# Patient Record
Sex: Female | Born: 2000 | Race: Black or African American | Hispanic: No | Marital: Single | Smoking: Current every day smoker
Health system: Southern US, Community
[De-identification: ages and names within clinical notes are randomized; demographics above are authoritative.]

## PROBLEM LIST (undated history)

## (undated) ENCOUNTER — Ambulatory Visit (HOSPITAL_COMMUNITY): Admission: EM | Payer: MEDICAID | Source: Home / Self Care

## (undated) DIAGNOSIS — R519 Headache, unspecified: Secondary | ICD-10-CM

## (undated) DIAGNOSIS — F419 Anxiety disorder, unspecified: Secondary | ICD-10-CM

## (undated) DIAGNOSIS — L309 Dermatitis, unspecified: Secondary | ICD-10-CM

## (undated) DIAGNOSIS — N939 Abnormal uterine and vaginal bleeding, unspecified: Secondary | ICD-10-CM

## (undated) DIAGNOSIS — N946 Dysmenorrhea, unspecified: Secondary | ICD-10-CM

## (undated) DIAGNOSIS — R51 Headache: Secondary | ICD-10-CM

## (undated) DIAGNOSIS — F32A Depression, unspecified: Secondary | ICD-10-CM

## (undated) HISTORY — DX: Abnormal uterine and vaginal bleeding, unspecified: N93.9

## (undated) HISTORY — DX: Dysmenorrhea, unspecified: N94.6

## (undated) HISTORY — DX: Depression, unspecified: F32.A

## (undated) HISTORY — DX: Anxiety disorder, unspecified: F41.9

---
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
## 2018-09-03 ENCOUNTER — Ambulatory Visit (INDEPENDENT_AMBULATORY_CARE_PROVIDER_SITE_OTHER): Payer: Self-pay

## 2018-09-03 VITALS — Wt 249.0 lb

## 2018-09-03 DIAGNOSIS — Z113 Encounter for screening for infections with a predominantly sexual mode of transmission: Secondary | ICD-10-CM

## 2018-09-03 DIAGNOSIS — N898 Other specified noninflammatory disorders of vagina: Secondary | ICD-10-CM

## 2018-09-03 NOTE — Progress Notes (Signed)
Pt is here for STI testing, she is having discharge no smell no itching no burning. Wants to just get checked to be sure. 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

## 2018-09-05 LAB — CERVICOVAGINAL ANCILLARY ONLY
Bacterial vaginitis: NEGATIVE
CANDIDA VAGINITIS: NEGATIVE
CHLAMYDIA, DNA PROBE: NEGATIVE
Neisseria Gonorrhea: NEGATIVE
TRICH (WINDOWPATH): POSITIVE — AB

## 2018-09-09 ENCOUNTER — Other Ambulatory Visit: Payer: Self-pay | Admitting: Family Medicine

## 2018-09-09 ENCOUNTER — Telehealth: Payer: Self-pay | Admitting: *Deleted

## 2018-09-09 DIAGNOSIS — A599 Trichomoniasis, unspecified: Secondary | ICD-10-CM

## 2018-09-09 MED ORDER — METRONIDAZOLE 500 MG PO TABS: 2000.0000 mg | ORAL_TABLET | Freq: Once | ORAL | 0 refills | Status: AC

## 2018-09-09 MED ORDER — METRONIDAZOLE 500 MG PO TABS: 2000.0000 mg | ORAL_TABLET | Freq: Once | ORAL | 0 refills | Status: DC

## 2018-09-09 NOTE — Telephone Encounter (Signed)
Received a voicemail  from [facility] to clarify order sent in by Dr. Aneta Mins for flagyl.  I called [facility] and wanted to clarify if order was for 4 tablets or 14 tablets. Per chart review was for treatment of trichomonas and our protocol is 2 grams ( 4 tablets ) once so I clarified order is 4 tablets once only.  Will notify Dr. Vear Clock.

## 2018-09-09 NOTE — Progress Notes (Signed)
Metronidazole sent to pharmacy for trichomoniasis. Patient notified via [employer].

## 2019-09-12 ENCOUNTER — Emergency Department (HOSPITAL_COMMUNITY): Payer: Self-pay

## 2019-09-12 ENCOUNTER — Encounter (HOSPITAL_COMMUNITY): Payer: Self-pay | Admitting: Emergency Medicine

## 2019-09-12 ENCOUNTER — Emergency Department (HOSPITAL_COMMUNITY)
Admission: EM | Admit: 2019-09-12 | Discharge: 2019-09-12 | Disposition: A | Discharge status: Home / Self Care | Payer: Self-pay | Attending: Emergency Medicine | Admitting: Emergency Medicine

## 2019-09-12 ENCOUNTER — Other Ambulatory Visit: Payer: Self-pay

## 2019-09-12 DIAGNOSIS — Y999 Unspecified external cause status: Secondary | ICD-10-CM | POA: Insufficient documentation

## 2019-09-12 DIAGNOSIS — S93402A Sprain of unspecified ligament of left ankle, initial encounter: Secondary | ICD-10-CM | POA: Insufficient documentation

## 2019-09-12 DIAGNOSIS — Y929 Unspecified place or not applicable: Secondary | ICD-10-CM | POA: Insufficient documentation

## 2019-09-12 DIAGNOSIS — W19XXXA Unspecified fall, initial encounter: Secondary | ICD-10-CM | POA: Insufficient documentation

## 2019-09-12 DIAGNOSIS — F17210 Nicotine dependence, cigarettes, uncomplicated: Secondary | ICD-10-CM | POA: Insufficient documentation

## 2019-09-12 DIAGNOSIS — Y939 Activity, unspecified: Secondary | ICD-10-CM | POA: Insufficient documentation

## 2019-09-12 DIAGNOSIS — S93601A Unspecified sprain of right foot, initial encounter: Secondary | ICD-10-CM | POA: Insufficient documentation

## 2019-09-12 MED ORDER — HYDROCODONE-ACETAMINOPHEN 5-325 MG PO TABS
1.0000 | ORAL_TABLET | Freq: Once | ORAL | Status: AC
  Administered 2019-09-12: 1 via ORAL
  Filled 2019-09-12: qty 1
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
## 2019-09-12 NOTE — Discharge Instructions (Signed)
You can take Tylenol or Ibuprofen as directed for pain. You can alternate Tylenol and Ibuprofen every 4 hours. If you take Tylenol at 1pm, then you can take Ibuprofen at 5pm. Then you can take Tylenol again at 9pm.   Follow the RICE (Rest, Ice, Compression, Elevation) protocol as directed.   Follow up with your Primary Care Doctor as needed.   Return to the Emergency Department immediately for any worsening pain, redness/swelling of the ankle, gray or blue color to the toes, numbness/weakness of toes or foot, difficulty walking or any other worsening or concerning symptoms.    Ankle sprain Ankle sprain occurs when the ligaments that hold the ankle joint to get her are stretched or torn. It may take 4-6 weeks to heal.  For activity: Use crutches with nonweightbearing for the first few days. Then, you may walk on your ankles as the pain allows, or as instructed. Start gradually with weight bearing on the affected ankle. Once you can walk pain free, then try jogging. When you can run forwards, then you can try moving side to side. If you cannot walk without crutches in one week, you need a recheck by your Family Doctor.  If you do not have a family doctor to followup with, you can see the list of phone numbers below. Please call today to make a followup appointment.   RICE therapy:  Routine Care for injuries  Rest, Ice, Compression, Elevation (RICE)  Rest is needed to allow your body to heal. Routine activities can be resumed when comfortable. Injury tendons and bones can take up to 6 weeks to heal. Tendons are cordlike structures that attach muscles and bones.  Ice following an injury helps keep the swelling down and reduce the pain. Put ice in a plastic bag. Place a towel between your skin and the bag of ice. Leave the ice on for 15-20 minutes, 3-4 times a day. Do this while awake, for the first 24-48 hours. After that continue as directed by your caregiver.  Compression helps keep swelling  down. It also gives support and helps with discomfort. If any lasting bandage has been applied, it should be removed and reapplied every 3-4 hours. It should not be applied tightly, but firmly enough to keep swelling down. Watch fingers or toes for swelling, discoloration, coldness, numbness or excessive pain. If any of these problems occur, removed the bandage and reapply loosely. Contact your caregiver if these problems continue.  Elevation helps reduce swelling and decrease your pain. With extremities such as the arms, hands, legs and feet, the injured area should be placed near or above the level of the heart if possible.
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
## 2020-06-09 ENCOUNTER — Ambulatory Visit (HOSPITAL_COMMUNITY)
Admission: EM | Admit: 2020-06-09 | Discharge: 2020-06-09 | Disposition: A | Discharge status: Home / Self Care | Payer: Self-pay | Attending: Family Medicine | Admitting: Family Medicine

## 2020-06-09 ENCOUNTER — Encounter (HOSPITAL_COMMUNITY): Payer: Self-pay | Admitting: *Deleted

## 2020-06-09 ENCOUNTER — Other Ambulatory Visit: Payer: Self-pay

## 2020-06-09 DIAGNOSIS — L301 Dyshidrosis [pompholyx]: Secondary | ICD-10-CM

## 2020-06-09 MED ORDER — PREDNISONE 5 MG PO TABS: ORAL_TABLET | ORAL | 0 refills | Status: DC

## 2020-06-09 NOTE — ED Triage Notes (Signed)
Pt reports she first had a blister on Rt small toe which later opened up . Skin is now dry on Rt small toe. Pt also reports pain between rt  GREAT TOE AND RT 2ND TOE.

## 2020-06-09 NOTE — Discharge Instructions (Signed)
Please try the medicine  Please try lotion  Please follow up if your symptoms fail to improve.
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
## 2020-07-11 ENCOUNTER — Other Ambulatory Visit: Payer: Self-pay

## 2020-07-11 ENCOUNTER — Encounter (HOSPITAL_COMMUNITY): Payer: Self-pay

## 2020-07-11 ENCOUNTER — Ambulatory Visit (HOSPITAL_COMMUNITY)
Admission: EM | Admit: 2020-07-11 | Discharge: 2020-07-11 | Disposition: A | Discharge status: Home / Self Care | Payer: Self-pay | Attending: Emergency Medicine | Admitting: Emergency Medicine

## 2020-07-11 DIAGNOSIS — L309 Dermatitis, unspecified: Secondary | ICD-10-CM

## 2020-07-11 DIAGNOSIS — B084 Enteroviral vesicular stomatitis with exanthem: Secondary | ICD-10-CM

## 2020-07-11 DIAGNOSIS — R21 Rash and other nonspecific skin eruption: Secondary | ICD-10-CM

## 2020-07-11 MED ORDER — TRIAMCINOLONE ACETONIDE 0.1 % EX CREA: 1.0000 "application " | TOPICAL_CREAM | Freq: Two times a day (BID) | CUTANEOUS | 0 refills | Status: DC

## 2020-07-11 MED ORDER — PREDNISONE 10 MG (21) PO TBPK: ORAL_TABLET | Freq: Every day | ORAL | 0 refills | Status: DC
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
## 2020-07-11 NOTE — Discharge Instructions (Signed)
Wash dry areas well  Apply cream  May take several weeks to go away  Keep a good non perfume emollient to area

## 2020-07-11 NOTE — ED Triage Notes (Signed)
Pt presents with recurrent rash/blisters on hands and feet

## 2020-08-08 ENCOUNTER — Encounter (HOSPITAL_COMMUNITY): Payer: Self-pay | Admitting: Emergency Medicine

## 2020-08-08 ENCOUNTER — Emergency Department (HOSPITAL_COMMUNITY)
Admission: EM | Admit: 2020-08-08 | Discharge: 2020-08-09 | Disposition: A | Discharge status: Home / Self Care | Payer: Self-pay | Attending: Emergency Medicine | Admitting: Emergency Medicine

## 2020-08-08 ENCOUNTER — Other Ambulatory Visit: Payer: Self-pay

## 2020-08-08 DIAGNOSIS — M542 Cervicalgia: Secondary | ICD-10-CM | POA: Insufficient documentation

## 2020-08-08 DIAGNOSIS — M545 Low back pain, unspecified: Secondary | ICD-10-CM | POA: Insufficient documentation

## 2020-08-08 DIAGNOSIS — Y92410 Unspecified street and highway as the place of occurrence of the external cause: Secondary | ICD-10-CM | POA: Insufficient documentation

## 2020-08-08 DIAGNOSIS — F17200 Nicotine dependence, unspecified, uncomplicated: Secondary | ICD-10-CM | POA: Insufficient documentation

## 2020-08-08 MED ORDER — METHOCARBAMOL 500 MG PO TABS: 500.0000 mg | ORAL_TABLET | Freq: Two times a day (BID) | ORAL | 0 refills | Status: DC

## 2020-08-08 MED ORDER — NAPROXEN 500 MG PO TABS: 500.0000 mg | ORAL_TABLET | Freq: Two times a day (BID) | ORAL | 0 refills | Status: DC

## 2020-08-08 MED ORDER — LIDOCAINE 5 % EX PTCH
2.0000 | MEDICATED_PATCH | CUTANEOUS | Status: DC
  Administered 2020-08-08: 2 via TRANSDERMAL
  Filled 2020-08-08: qty 2

## 2020-08-08 MED ORDER — NAPROXEN 500 MG PO TABS
500.0000 mg | ORAL_TABLET | Freq: Once | ORAL | Status: AC
  Administered 2020-08-08: 500 mg via ORAL
  Filled 2020-08-08: qty 1

## 2020-08-08 MED ORDER — METHOCARBAMOL 500 MG PO TABS
500.0000 mg | ORAL_TABLET | Freq: Once | ORAL | Status: AC
  Administered 2020-08-08: 500 mg via ORAL
  Filled 2020-08-08: qty 1
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
## 2020-08-08 NOTE — ED Notes (Signed)
ED Provider at bedside. 
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
## 2020-12-23 ENCOUNTER — Other Ambulatory Visit: Payer: Self-pay

## 2020-12-23 ENCOUNTER — Emergency Department (HOSPITAL_COMMUNITY): Payer: Self-pay

## 2020-12-23 ENCOUNTER — Emergency Department (HOSPITAL_COMMUNITY)
Admission: EM | Admit: 2020-12-23 | Discharge: 2020-12-23 | Disposition: A | Discharge status: Home / Self Care | Payer: Self-pay | Attending: Emergency Medicine | Admitting: Emergency Medicine

## 2020-12-23 ENCOUNTER — Encounter (HOSPITAL_COMMUNITY): Payer: Self-pay

## 2020-12-23 DIAGNOSIS — R609 Edema, unspecified: Secondary | ICD-10-CM | POA: Insufficient documentation

## 2020-12-23 DIAGNOSIS — M79641 Pain in right hand: Secondary | ICD-10-CM

## 2020-12-23 DIAGNOSIS — F17290 Nicotine dependence, other tobacco product, uncomplicated: Secondary | ICD-10-CM | POA: Insufficient documentation

## 2020-12-23 DIAGNOSIS — M25541 Pain in joints of right hand: Secondary | ICD-10-CM | POA: Insufficient documentation
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
## 2020-12-23 NOTE — ED Triage Notes (Signed)
Patient states she has had intermittent right hand swelling and pain x 2 years. Patient states worse recently. 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

## 2020-12-23 NOTE — Discharge Instructions (Addendum)
Your hand x-ray was reassuring.  Please follow-up with a hand specialist as we discussed.  Please take Naproxen (aleve) and Tylenol (acetaminophen) to relieve your pain.  You may take two pills of aleve every 12 hours.  After 2-3 days please try to only take one pill every 12 hours.  In between doses of aleve you may take tylenol, up to 1,000 mg (two extra strength pills).  Do not take more than 3,000 mg tylenol in a 24 hour period.  Please check all medication labels as many medications such as pain and cold medications may contain tylenol.  Do not drink alcohol while taking these medications.  Do not take other NSAID'S while taking aleve (naproxen) (such as motrin, ibuprofen, voltaren or advil).  Please take aleve (naproxen) with food to decrease stomach upset.  If the aleve irritates your stomach you may stop and get voltaren gel.  This is over the counter and follow directions on the box.    While in the ED your blood pressure was high.  Please follow up with your primary care doctor or the wellness clinic for repeat evaluation as you may need medication.  High blood pressure can cause long term, potentially serious, damage if left untreated.

## 2021-02-08 ENCOUNTER — Encounter (HOSPITAL_COMMUNITY): Payer: Self-pay

## 2021-02-08 ENCOUNTER — Ambulatory Visit (HOSPITAL_COMMUNITY)
Admission: EM | Admit: 2021-02-08 | Discharge: 2021-02-08 | Disposition: A | Discharge status: Home / Self Care | Payer: Self-pay

## 2021-02-08 ENCOUNTER — Other Ambulatory Visit: Payer: Self-pay

## 2021-02-08 DIAGNOSIS — M722 Plantar fascial fibromatosis: Secondary | ICD-10-CM

## 2021-02-08 NOTE — ED Triage Notes (Signed)
Pt presents with right heel pain. She states she is unable to apply pressure to the right heel and states she feels better when wearing a shoe with extra cushion.

## 2021-02-08 NOTE — Discharge Instructions (Addendum)
Stretch often, see attached stretching information.  Recommend freezing a bottle of water and rolling the foot Can take 600mg  Ibuprofen as needed Can wear night time brace as discussed.  If no improvement follow up with orthopedics.
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
## 2022-03-26 ENCOUNTER — Emergency Department (HOSPITAL_COMMUNITY): Payer: Self-pay

## 2022-03-26 ENCOUNTER — Encounter (HOSPITAL_COMMUNITY): Payer: Self-pay | Admitting: Emergency Medicine

## 2022-03-26 ENCOUNTER — Emergency Department (HOSPITAL_COMMUNITY)
Admission: EM | Admit: 2022-03-26 | Discharge: 2022-03-26 | Disposition: A | Discharge status: Home / Self Care | Payer: Self-pay | Attending: Emergency Medicine | Admitting: Emergency Medicine

## 2022-03-26 DIAGNOSIS — K2900 Acute gastritis without bleeding: Secondary | ICD-10-CM | POA: Insufficient documentation

## 2022-03-26 DIAGNOSIS — M545 Low back pain, unspecified: Secondary | ICD-10-CM | POA: Insufficient documentation

## 2022-03-26 DIAGNOSIS — R1013 Epigastric pain: Secondary | ICD-10-CM

## 2022-03-26 LAB — COMPREHENSIVE METABOLIC PANEL
ALT: 22 U/L (ref 0–44)
AST: 16 U/L (ref 15–41)
Albumin: 4 g/dL (ref 3.5–5.0)
Alkaline Phosphatase: 48 U/L (ref 38–126)
Anion gap: 5 (ref 5–15)
BUN: 16 mg/dL (ref 6–20)
CO2: 22 mmol/L (ref 22–32)
Calcium: 8.9 mg/dL (ref 8.9–10.3)
Chloride: 110 mmol/L (ref 98–111)
Creatinine, Ser: 0.76 mg/dL (ref 0.44–1.00)
GFR, Estimated: >60 mL/min (ref >60)
Glucose, Bld: 88 mg/dL (ref 70–99)
Potassium: 4.2 mmol/L (ref 3.5–5.1)
Sodium: 137 mmol/L (ref 135–145)
Total Bilirubin: 0.4 mg/dL (ref 0.3–1.2)
Total Protein: 7.3 g/dL (ref 6.5–8.1)

## 2022-03-26 LAB — URINALYSIS, ROUTINE W REFLEX MICROSCOPIC
Bilirubin Urine: NEGATIVE
Glucose, UA: NEGATIVE mg/dL
Hgb urine dipstick: NEGATIVE
Ketones, ur: NEGATIVE mg/dL
Leukocytes,Ua: NEGATIVE
Nitrite: NEGATIVE
Protein, ur: NEGATIVE mg/dL
Specific Gravity, Urine: 1.019 (ref 1.005–1.030)
pH: 6 (ref 5.0–8.0)

## 2022-03-26 LAB — CBC WITH DIFFERENTIAL/PLATELET
Abs Immature Granulocytes: 0.05 10*3/uL (ref 0.00–0.07)
Basophils Absolute: 0 10*3/uL (ref 0.0–0.1)
Basophils Relative: 0 %
Eosinophils Absolute: 0.2 10*3/uL (ref 0.0–0.5)
Eosinophils Relative: 1 %
HCT: 42.5 % (ref 36.0–46.0)
Hemoglobin: 13.5 g/dL (ref 12.0–15.0)
Immature Granulocytes: 0 %
Lymphocytes Relative: 24 %
Lymphs Abs: 3.6 10*3/uL (ref 0.7–4.0)
MCH: 26.4 pg (ref 26.0–34.0)
MCHC: 31.8 g/dL (ref 30.0–36.0)
MCV: 83 fL (ref 80.0–100.0)
Monocytes Absolute: 1.2 10*3/uL — ABNORMAL HIGH (ref 0.1–1.0)
Monocytes Relative: 8 %
Neutro Abs: 10 10*3/uL — ABNORMAL HIGH (ref 1.7–7.7)
Neutrophils Relative %: 67 %
Platelets: 245 10*3/uL (ref 150–400)
RBC: 5.12 MIL/uL — ABNORMAL HIGH (ref 3.87–5.11)
RDW: 14.6 % (ref 11.5–15.5)
WBC: 15.1 10*3/uL — ABNORMAL HIGH (ref 4.0–10.5)
nRBC: 0 % (ref 0.0–0.2)

## 2022-03-26 LAB — LIPASE, BLOOD: Lipase: 23 U/L (ref 11–51)

## 2022-03-26 LAB — I-STAT BETA HCG BLOOD, ED (MC, WL, AP ONLY): I-stat hCG, quantitative: <5 m[IU]/mL (ref <5)

## 2022-03-26 MED ORDER — FAMOTIDINE 20 MG PO TABS: 20.0000 mg | ORAL_TABLET | Freq: Two times a day (BID) | ORAL | 0 refills | Status: DC

## 2022-03-26 MED ORDER — LIDOCAINE VISCOUS HCL 2 % MT SOLN
15.0000 mL | Freq: Once | OROMUCOSAL | Status: AC
  Administered 2022-03-26: 15 mL via ORAL
  Filled 2022-03-26: qty 15

## 2022-03-26 MED ORDER — FAMOTIDINE IN NACL 20-0.9 MG/50ML-% IV SOLN
20.0000 mg | Freq: Once | INTRAVENOUS | Status: AC
  Administered 2022-03-26: 20 mg via INTRAVENOUS
  Filled 2022-03-26: qty 50

## 2022-03-26 MED ORDER — KETOROLAC TROMETHAMINE 15 MG/ML IJ SOLN
15.0000 mg | Freq: Once | INTRAMUSCULAR | Status: AC
  Administered 2022-03-26: 15 mg via INTRAVENOUS
  Filled 2022-03-26: qty 1

## 2022-03-26 MED ORDER — ALUM & MAG HYDROXIDE-SIMETH 200-200-20 MG/5ML PO SUSP
30.0000 mL | Freq: Once | ORAL | Status: AC
  Administered 2022-03-26: 30 mL via ORAL
  Filled 2022-03-26: qty 30

## 2022-03-26 NOTE — ED Triage Notes (Signed)
Pt reports sharp abd pain starting last night. Intermittent pain to abd and back. Denies n/v.
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
## 2022-03-26 NOTE — ED Provider Triage Note (Signed)
Emergency Medicine Provider Triage Evaluation Note  Joanna Fitzgerald , a 21 y.o. female  was evaluated in triage.  Pt complains of abdominal pain, intermittent over the past several days.  Pain is in the upper abdomen, right greater than left.  Pain radiates to the right mid back.  No fevers or vomiting.  No history of gallstones.  Review of Systems  Positive: Abdominal pain Negative: Fever  Physical Exam  BP (!) 142/61 (BP Location: Right Arm)   Pulse 76   Temp 98.2 F (36.8 C) (Oral)   Resp 18   Ht 5' 2.5" (1.588 m)   Wt 108.9 kg   LMP 03/20/2022   SpO2 100%   BMI 43.20 kg/m  Gen:   Awake, no distress   Resp:  Normal effort  MSK:   Moves extremities without difficulty  Other:  No rebound or guarding with palpation on the abdomen  Medical Decision Making  Medically screening exam initiated at 2:31 PM.  Appropriate orders placed.  Joanna Fitzgerald was informed that the remainder of the evaluation will be completed by another provider, this initial triage assessment does not replace that evaluation, and the importance of remaining in the ED until their evaluation is complete.     Renne Crigler, PA-C 03/26/22 1441

## 2022-03-26 NOTE — Discharge Instructions (Addendum)
Today you were seen in the emergency department for your abdominal pain.    In the emergency department you had an ultrasound and lab work that was reassuring. It is likely that your symptoms are from irritation of your stomach (gastritis)  At home, please take the pepcid we have prescribed and over the counter maalox as needed for discomfort. Please avoid NSAIDS (ibuprofen, aleve, etc) and alcohol as much as possible as these can cause worsening stomach irritation.    Follow-up with your primary doctor in 2-3 days regarding your visit or use the clinic listed in this packet to establish care with family medicine.   Return immediately to the emergency department if you experience any of the following: worsening pain, high fevers, vomiting, or any other concerning symptoms.    Thank you for visiting our Emergency Department. It was a pleasure taking care of you today.

## 2022-04-29 ENCOUNTER — Emergency Department (HOSPITAL_COMMUNITY)
Admission: EM | Admit: 2022-04-29 | Discharge: 2022-04-29 | Disposition: A | Discharge status: Home / Self Care | Payer: Medicaid Other | Attending: Emergency Medicine | Admitting: Emergency Medicine

## 2022-04-29 ENCOUNTER — Encounter (HOSPITAL_COMMUNITY): Payer: Self-pay

## 2022-04-29 ENCOUNTER — Other Ambulatory Visit: Payer: Self-pay

## 2022-04-29 DIAGNOSIS — J020 Streptococcal pharyngitis: Secondary | ICD-10-CM | POA: Insufficient documentation

## 2022-04-29 DIAGNOSIS — Z20822 Contact with and (suspected) exposure to covid-19: Secondary | ICD-10-CM | POA: Insufficient documentation

## 2022-04-29 LAB — SARS CORONAVIRUS 2 BY RT PCR: SARS Coronavirus 2 by RT PCR: NEGATIVE

## 2022-04-29 LAB — GROUP A STREP BY PCR: Group A Strep by PCR: DETECTED — AB

## 2022-04-29 MED ORDER — PENICILLIN G BENZATHINE 1200000 UNIT/2ML IM SUSY
1.2000 10*6.[IU] | PREFILLED_SYRINGE | Freq: Once | INTRAMUSCULAR | Status: AC
  Administered 2022-04-29: 1.2 10*6.[IU] via INTRAMUSCULAR
  Filled 2022-04-29: qty 2

## 2022-04-29 MED ORDER — DEXAMETHASONE SODIUM PHOSPHATE 10 MG/ML IJ SOLN
10.0000 mg | Freq: Once | INTRAMUSCULAR | Status: AC
  Administered 2022-04-29: 10 mg via INTRAMUSCULAR
  Filled 2022-04-29: qty 1

## 2022-04-29 NOTE — ED Provider Notes (Signed)
[facility]   CSN: [id number] Arrival date & time: 04/29/22  5797     History  Chief Complaint  Patient presents with   Generalized Body Aches    Joanna Fitzgerald is a 21 y.o. female.  With past medical history of eczema who presents to the emergency department with generalized body aches.  States that symptoms began Tuesday.  She describes having sore throat, nonproductive cough, generalized body aches and congestion.  She has had no objective fevers at home.  She denies having productive cough or shortness of breath.  Denies sinus pain or pressure, difficulty swallowing, change in voice.  No sick contacts.  HPI     Home Medications Prior to Admission medications   Medication Sig Start Date End Date Taking? Authorizing Provider  Etonogestrel (NEXPLANON ) Inject into the skin.    [provider]  famotidine (PEPCID) 20 MG tablet Take 1 tablet (20 mg total) by mouth 2 (two) times daily. 03/26/22   Rondel Baton, MD  methocarbamol (ROBAXIN) 500 MG tablet Take 1 tablet (500 mg total) by mouth 2 (two) times daily. 08/08/20   Mannie Stabile, PA-C  naproxen (NAPROSYN) 500 MG tablet Take 1 tablet (500 mg total) by mouth 2 (two) times daily. 08/08/20   Mannie Stabile, PA-C  predniSONE (STERAPRED UNI-PAK 21 TAB) 10 MG (21) TBPK tablet Take by mouth daily. Take 6 tabs by mouth daily  for 2 days, then 5 tabs for 2 days, then 4 tabs for 2 days, then 3 tabs for 2 days, 2 tabs for 2 days, then 1 tab by mouth daily for 2 days 07/11/20   Coralyn Mark, NP  triamcinolone (KENALOG) 0.1 % Apply 1 application topically 2 (two) times daily. 07/11/20   Coralyn Mark, NP      Allergies    Patient has no known allergies.    Review of Systems   Review of Systems  Constitutional:  Negative for fever.  HENT:  Positive for congestion and sore throat. Negative for trouble swallowing.   Respiratory:  Positive for cough.  Negative for shortness of breath.   All other systems reviewed and are negative.   Physical Exam Updated Vital Signs BP (!) 147/71 (BP Location: Right Arm)   Pulse 80   Temp 98.4 F (36.9 C) (Oral)   Resp 17   SpO2 99%  Physical Exam Vitals and nursing note reviewed.  Constitutional:      General: She is not in acute distress.    Appearance: She is obese. She is ill-appearing. She is not toxic-appearing.  HENT:     Head: Normocephalic.     Nose: Congestion present.     Mouth/Throat:     Mouth: Mucous membranes are moist.     Pharynx: Posterior oropharyngeal erythema present.     Tonsils: No tonsillar exudate or tonsillar abscesses. 1+ on the right. 1+ on the left.  Eyes:     General: No scleral icterus. Cardiovascular:     Rate and Rhythm: Normal rate and regular rhythm.  Pulmonary:     Effort: Pulmonary effort is normal. No respiratory distress.  Abdominal:     General: Bowel sounds are normal.     Palpations: Abdomen is soft.  Lymphadenopathy:     Cervical: Cervical adenopathy present.  Skin:    General: Skin is warm and dry.     Capillary Refill: Capillary refill takes less than 2 seconds.  Neurological:  General: No focal deficit present.     Mental Status: She is alert and oriented to person, place, and time. Mental status is at baseline.  Psychiatric:        Mood and Affect: Mood normal.        Behavior: Behavior normal.        Thought Content: Thought content normal.        Judgment: Judgment normal.    ED Results / Procedures / Treatments   Labs (all labs ordered are listed, but only abnormal results are displayed) Labs Reviewed  GROUP A STREP BY PCR - Abnormal; Notable for the following components:      Result Value   Group A Strep by PCR DETECTED (*)    All other components within normal limits  SARS CORONAVIRUS 2 BY RT PCR    EKG None  Radiology No results found.  Procedures Procedures    Medications Ordered in ED Medications   penicillin g benzathine (BICILLIN LA) 1200000 UNIT/2ML injection 1.2 Million Units (has no administration in time range)  dexamethasone (DECADRON) injection 10 mg (has no administration in time range)    ED Course/ Medical Decision Making/ A&P                           Medical Decision Making Risk Prescription drug management.  This patient presents to the ED with chief complaint(s) of sore throat with pertinent past medical history of eczema which further complicates the presenting complaint. The complaint involves an extensive differential diagnosis and also carries with it a high risk of complications and morbidity.    The differential diagnosis includes strep pharyngitis, EBV, PTA, RPA, Ludwigs angina, epiglottitis, bacterial tracheitis, viral upper respiratory infection  Additional history obtained: Additional history obtained from  none available Records reviewed Care Everywhere/External Records  ED Course and Reassessment: 21 year old female who presents to the emergency department with cough, sore throat generalized body aches since Tuesday.  She is overall ill-appearing but nontoxic, nonseptic in appearance.  She has no history of immunocompromise.  She is euvolemic without trismus.  There is no airway compromise.  There is no change in voice or tonsillar exudates.  She does have tonsillar swelling and oropharyngeal erythema as well as cervical adenopathy.  She has been able to tolerate p.o. without difficulty.  Based on her exam and history of low suspicion for PTA, RPA, epiglottitis, bacterial tracheitis, EBV.  Symptoms are inconsistent with Ludwig's angina.   COVID is negative Strep positive I discussed options with her including IM penicillin x1 here versus 10 days of oral home antibiotic therapy.  She has opted for IM penicillin x1 here.  I have ordered this.  We will also order Decadron to help with her oropharyngeal swelling.  She instructed to use cough drops,  over-the-counter cough and cold medications as needed for other symptomatic treatment but should otherwise improve over the next few days.  Instructed return if she has difficulty swallowing liquids or difficulty breathing.  She verbalized understanding.  Otherwise feel that she is safe for discharge at this time.  Independent labs interpretation:  The following labs were independently interpreted: Strep positive, COVID-negative  Independent visualization of imaging: Not indicated  Consultation: - Consulted or discussed management/test interpretation w/ external professional: Not indicated  Consideration for admission or further workup: Not indicated Social Determinants of health: None identified Final Clinical Impression(s) / ED Diagnoses Final diagnoses:  Strep pharyngitis    Rx / DC  Orders ED Discharge Orders     None         Cristopher Peru, PA-C 04/29/22 0102    Pricilla Loveless, MD 05/03/22 [phone]

## 2022-04-29 NOTE — Discharge Instructions (Addendum)
You were seen in the emergency department today for cough, sore throat.  You have strep throat.  We gave you antibiotic treatment here in the emergency department.  Also gave you steroids to help with the swelling in your throat.  If you have difficulty swallowing liquids, drooling, change in voice or difficulty breathing please return to the emergency department.  Otherwise please use over-the-counter cough and cold medications, Tylenol and Motrin for fever or body aches.  Your symptoms should hopefully improve over the next few days.

## 2022-04-29 NOTE — ED Triage Notes (Signed)
Patient said she has been feeling body aches since Tuesday. Coughing up yellow mucus, runny nose, sore throat. She wants to be checked for covid.

## 2022-06-15 ENCOUNTER — Encounter (HOSPITAL_COMMUNITY): Payer: Self-pay

## 2022-06-15 ENCOUNTER — Ambulatory Visit (HOSPITAL_COMMUNITY)
Admission: EM | Admit: 2022-06-15 | Discharge: 2022-06-15 | Disposition: A | Discharge status: Home / Self Care | Payer: Self-pay | Attending: Family Medicine | Admitting: Family Medicine

## 2022-06-15 ENCOUNTER — Ambulatory Visit (INDEPENDENT_AMBULATORY_CARE_PROVIDER_SITE_OTHER): Payer: Self-pay

## 2022-06-15 DIAGNOSIS — W19XXXA Unspecified fall, initial encounter: Secondary | ICD-10-CM

## 2022-06-15 DIAGNOSIS — M25512 Pain in left shoulder: Secondary | ICD-10-CM

## 2022-06-15 MED ORDER — KETOROLAC TROMETHAMINE 30 MG/ML IJ SOLN
INTRAMUSCULAR | Status: AC
  Filled 2022-06-15: qty 1

## 2022-06-15 MED ORDER — KETOROLAC TROMETHAMINE 30 MG/ML IJ SOLN
30.0000 mg | Freq: Once | INTRAMUSCULAR | Status: AC
  Administered 2022-06-15: 30 mg via INTRAMUSCULAR

## 2022-06-15 MED ORDER — IBUPROFEN 800 MG PO TABS: 800.0000 mg | ORAL_TABLET | Freq: Three times a day (TID) | ORAL | 0 refills | Status: DC | PRN
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
## 2022-06-15 NOTE — Discharge Instructions (Signed)
Your x-rays did not show any bony abnormality  You have been given a shot of Toradol 30 mg today.  Take ibuprofen 800 mg--1 tab every 8 hours as needed for pain.

## 2022-10-08 ENCOUNTER — Telehealth: Payer: Self-pay

## 2022-10-08 NOTE — Telephone Encounter (Signed)
Mychart msg sent. AS, CMA

## 2023-03-03 IMAGING — CR DG HAND COMPLETE 3+V*R*
4 series · 4 of 4 positions shown · non-contrast
Comparison: 05/27/2017

CLINICAL DATA: Right hand pain and swelling for 2 years

EXAM:
RIGHT HAND - COMPLETE 3+ VIEW

[x hand pa right]
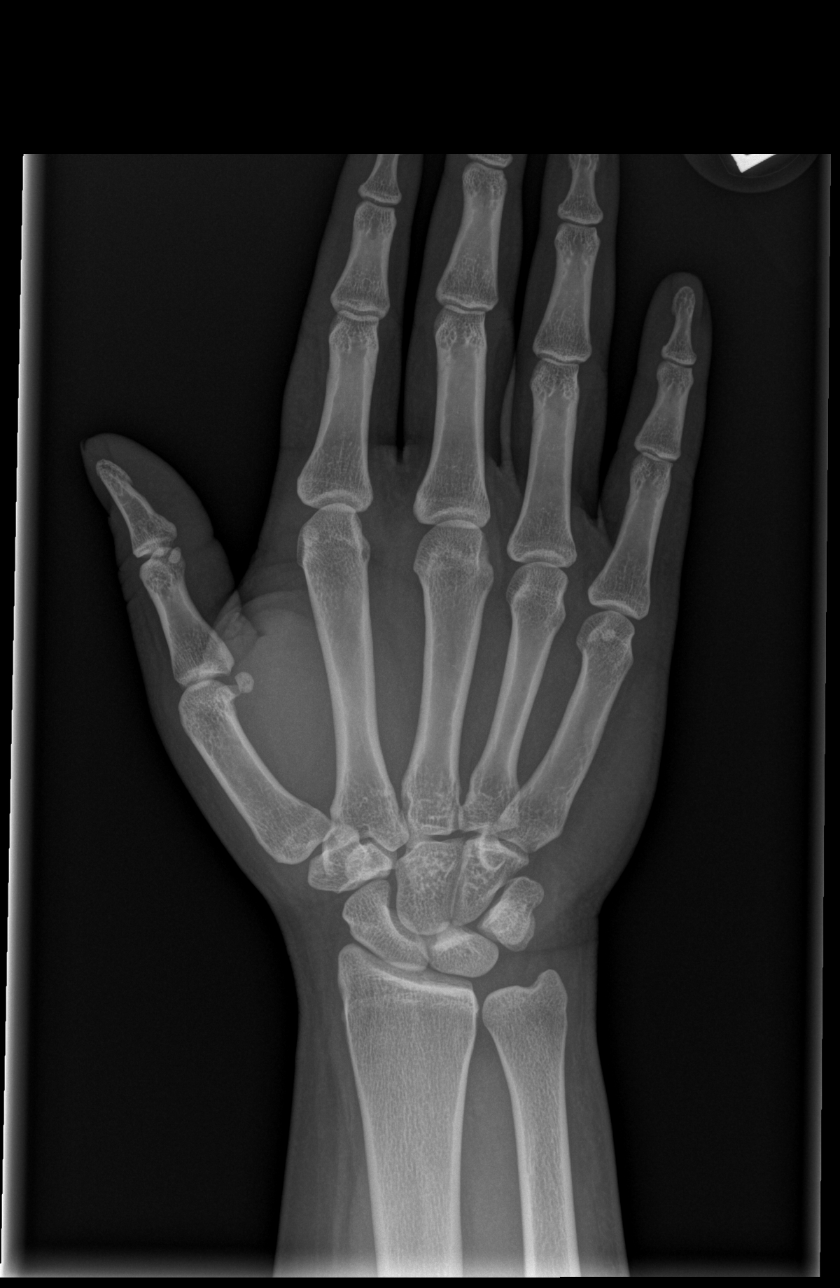

[x hand obl right]
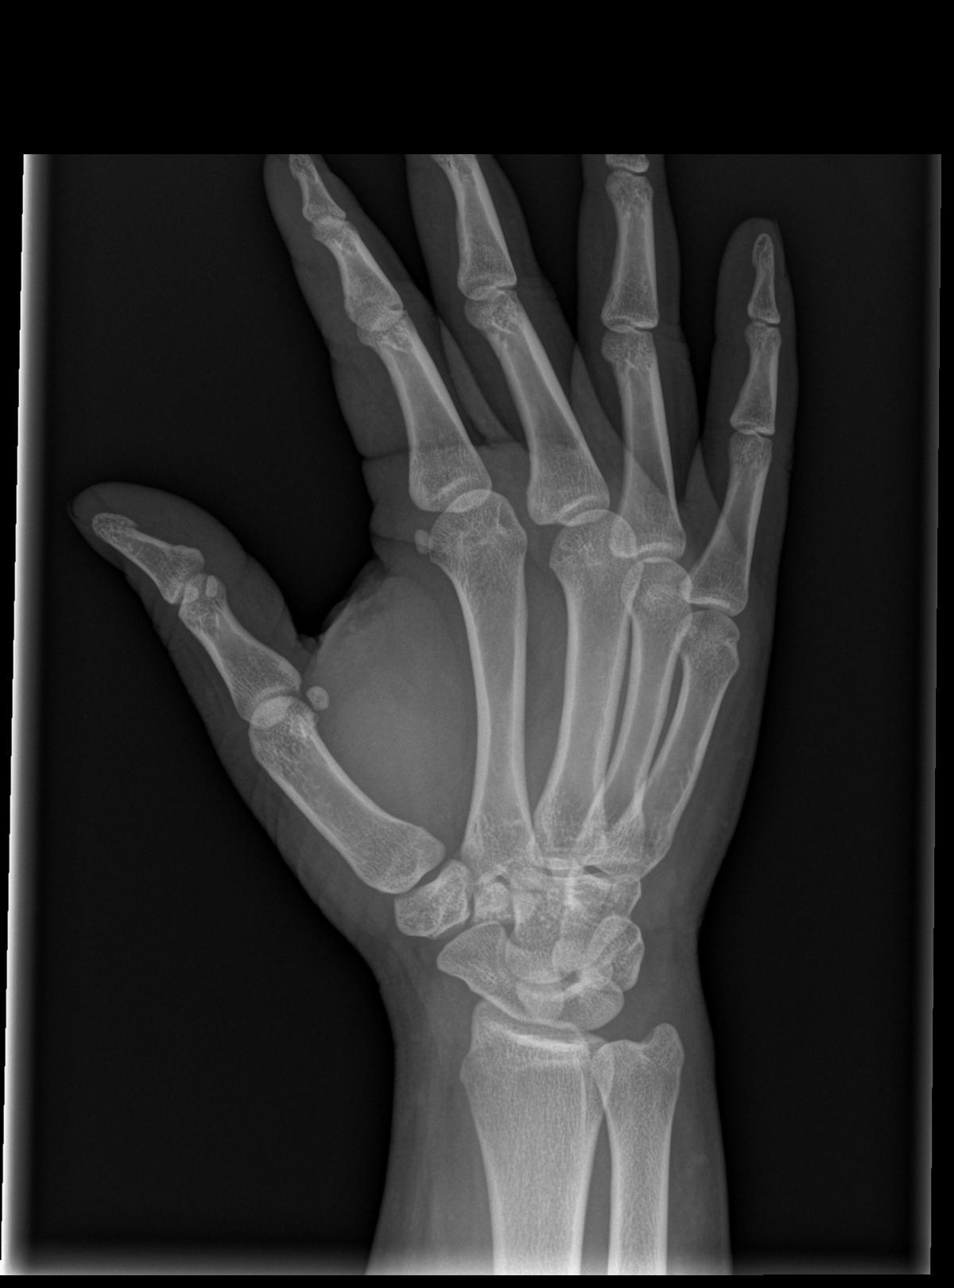

[x hand lat right (1 of 2)]
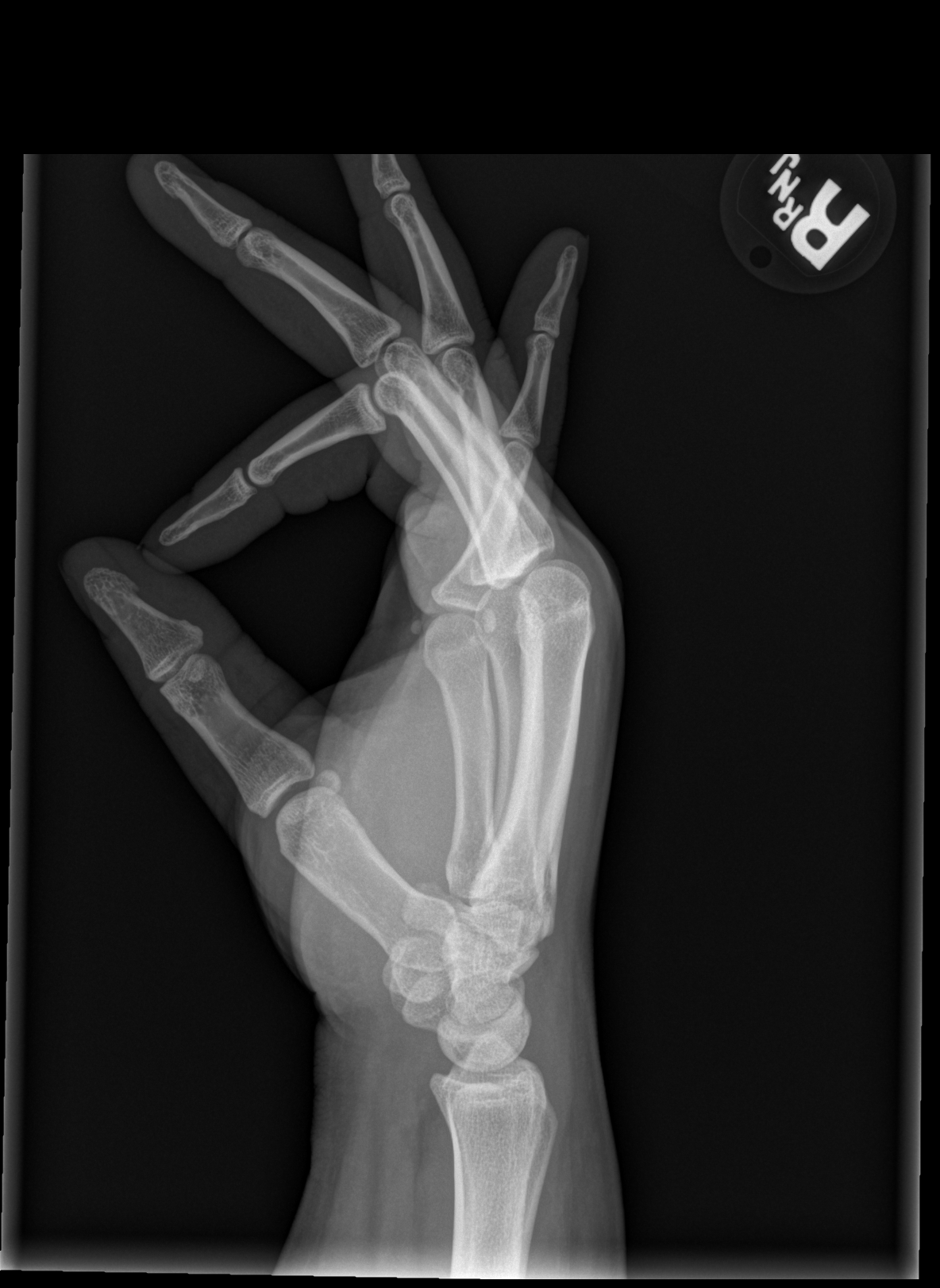

[x hand lat right (2 of 2)]
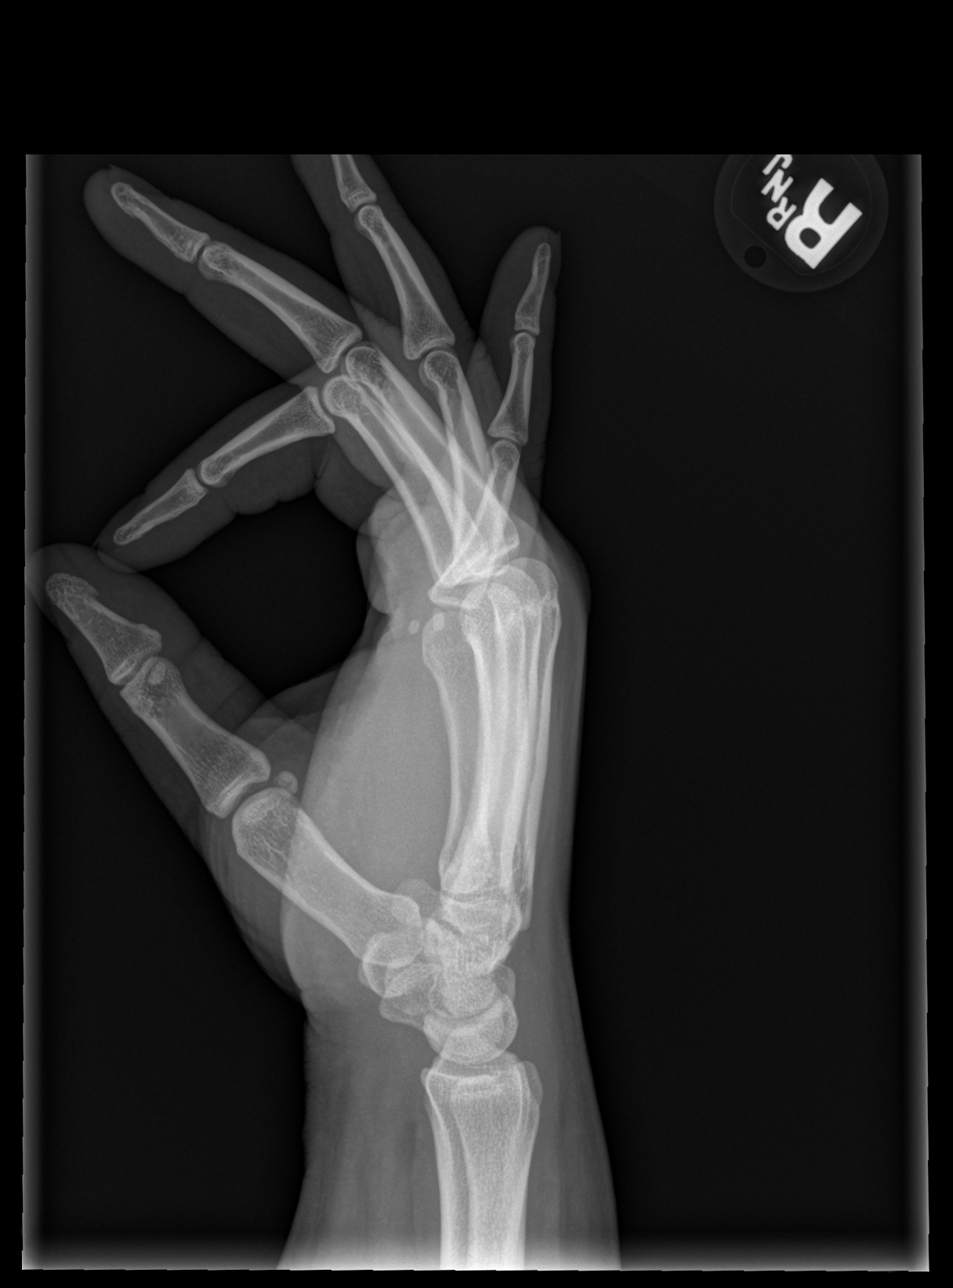

[4 of 4 positions shown; findings below may reference images not displayed]

FINDINGS: There is no evidence of fracture or dislocation. There is no
evidence of arthropathy or other focal bone abnormality. Soft
tissues are unremarkable.
IMPRESSION: Negative.

## 2023-03-23 ENCOUNTER — Other Ambulatory Visit: Payer: Self-pay

## 2023-03-23 ENCOUNTER — Encounter (HOSPITAL_COMMUNITY): Payer: Self-pay | Admitting: Emergency Medicine

## 2023-03-23 ENCOUNTER — Emergency Department (HOSPITAL_COMMUNITY): Payer: MEDICAID

## 2023-03-23 ENCOUNTER — Emergency Department (HOSPITAL_COMMUNITY)
Admission: EM | Admit: 2023-03-23 | Discharge: 2023-03-23 | Disposition: A | Discharge status: Home / Self Care | Payer: MEDICAID | Attending: Emergency Medicine | Admitting: Emergency Medicine

## 2023-03-23 DIAGNOSIS — R7989 Other specified abnormal findings of blood chemistry: Secondary | ICD-10-CM | POA: Diagnosis not present

## 2023-03-23 DIAGNOSIS — R0602 Shortness of breath: Secondary | ICD-10-CM | POA: Diagnosis present

## 2023-03-23 LAB — TROPONIN I (HIGH SENSITIVITY)
Troponin I (High Sensitivity): 6 ng/L (ref <18)
Troponin I (High Sensitivity): 26 ng/L — ABNORMAL HIGH (ref <18)
Troponin I (High Sensitivity): 39 ng/L — ABNORMAL HIGH (ref <18)

## 2023-03-23 LAB — CBC WITH DIFFERENTIAL/PLATELET
Abs Immature Granulocytes: 0.11 10*3/uL — ABNORMAL HIGH (ref 0.00–0.07)
Basophils Absolute: 0.1 10*3/uL (ref 0.0–0.1)
Basophils Relative: 0 %
Eosinophils Absolute: 0.1 10*3/uL (ref 0.0–0.5)
Eosinophils Relative: 1 %
HCT: 48 % — ABNORMAL HIGH (ref 36.0–46.0)
Hemoglobin: 14.7 g/dL (ref 12.0–15.0)
Immature Granulocytes: 1 %
Lymphocytes Relative: 33 %
Lymphs Abs: 6.5 10*3/uL — ABNORMAL HIGH (ref 0.7–4.0)
MCH: 26 pg (ref 26.0–34.0)
MCHC: 30.6 g/dL (ref 30.0–36.0)
MCV: 85 fL (ref 80.0–100.0)
Monocytes Absolute: 1.3 10*3/uL — ABNORMAL HIGH (ref 0.1–1.0)
Monocytes Relative: 7 %
Neutro Abs: 11.5 10*3/uL — ABNORMAL HIGH (ref 1.7–7.7)
Neutrophils Relative %: 58 %
Platelets: 238 10*3/uL (ref 150–400)
RBC: 5.65 MIL/uL — ABNORMAL HIGH (ref 3.87–5.11)
RDW: 15.6 % — ABNORMAL HIGH (ref 11.5–15.5)
WBC: 19.6 10*3/uL — ABNORMAL HIGH (ref 4.0–10.5)
nRBC: 0 % (ref 0.0–0.2)

## 2023-03-23 LAB — PREGNANCY, URINE: Preg Test, Ur: NEGATIVE

## 2023-03-23 LAB — COMPREHENSIVE METABOLIC PANEL
ALT: 22 U/L (ref 0–44)
AST: 27 U/L (ref 15–41)
Albumin: 4.7 g/dL (ref 3.5–5.0)
Alkaline Phosphatase: 61 U/L (ref 38–126)
Anion gap: 24 — ABNORMAL HIGH (ref 5–15)
BUN: 13 mg/dL (ref 6–20)
CO2: 12 mmol/L — ABNORMAL LOW (ref 22–32)
Calcium: 9.5 mg/dL (ref 8.9–10.3)
Chloride: 102 mmol/L (ref 98–111)
Creatinine, Ser: 1.48 mg/dL — ABNORMAL HIGH (ref 0.44–1.00)
GFR, Estimated: 50 mL/min — ABNORMAL LOW (ref >60)
Glucose, Bld: 129 mg/dL — ABNORMAL HIGH (ref 70–99)
Potassium: 3.1 mmol/L — ABNORMAL LOW (ref 3.5–5.1)
Sodium: 138 mmol/L (ref 135–145)
Total Bilirubin: 0.4 mg/dL (ref 0.3–1.2)
Total Protein: 8.3 g/dL — ABNORMAL HIGH (ref 6.5–8.1)

## 2023-03-23 LAB — D-DIMER, QUANTITATIVE: D-Dimer, Quant: <0.27 ug/mL-FEU (ref 0.00–0.50)

## 2023-03-23 MED ORDER — LACTATED RINGERS IV BOLUS
1000.0000 mL | Freq: Once | INTRAVENOUS | Status: AC
  Administered 2023-03-23: 1000 mL via INTRAVENOUS

## 2023-03-23 MED ORDER — LORAZEPAM 1 MG PO TABS
1.0000 mg | ORAL_TABLET | Freq: Once | ORAL | Status: AC
  Administered 2023-03-23: 1 mg via ORAL
  Filled 2023-03-23: qty 1

## 2023-03-23 MED ORDER — POTASSIUM CHLORIDE CRYS ER 20 MEQ PO TBCR
40.0000 meq | EXTENDED_RELEASE_TABLET | Freq: Once | ORAL | Status: AC
  Administered 2023-03-23: 40 meq via ORAL
  Filled 2023-03-23: qty 2

## 2023-03-23 NOTE — ED Notes (Signed)
Blue lab top drawn in triage.

## 2023-03-23 NOTE — Discharge Instructions (Signed)
You need to be seen by your regular doctor in 1-2 weeks to have your labs rechecked.  Return to the ER if symptoms change or worsen.

## 2023-03-23 NOTE — ED Notes (Signed)
Provided pt warm blanket 
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
## 2023-03-23 NOTE — ED Provider Notes (Signed)
Accepted handoff at shift change from [employer], [place]. Please see prior provider note for more detail.   Briefly: Patient is 22 y.o. presented to ED with shortness of breath, panic attack, and hyperventilating.  Patient initially felt anxious, but this has improved during ED stay.   DDX: concern for ACS, cardiac ischemia, panic attack  Plan: follow up on 3rd troponin, if continues to elevate, will consult cardiology.  If troponin remains stable or decreases, patient can be discharged home.    Results for orders placed or performed during the hospital encounter of 03/23/23  CBC with Differential  Result Value Ref Range   WBC 19.6 (H) 4.0 - 10.5 K/uL   RBC 5.65 (H) 3.87 - 5.11 MIL/uL   Hemoglobin 14.7 12.0 - 15.0 g/dL   HCT 16.1 (H) 09.6 - 04.5 %   MCV 85.0 80.0 - 100.0 fL   MCH 26.0 26.0 - 34.0 pg   MCHC 30.6 30.0 - 36.0 g/dL   RDW 40.9 (H) 81.1 - 91.4 %   Platelets 238 150 - 400 K/uL   nRBC 0.0 0.0 - 0.2 %   Neutrophils Relative % 58 %   Neutro Abs 11.5 (H) 1.7 - 7.7 K/uL   Lymphocytes Relative 33 %   Lymphs Abs 6.5 (H) 0.7 - 4.0 K/uL   Monocytes Relative 7 %   Monocytes Absolute 1.3 (H) 0.1 - 1.0 K/uL   Eosinophils Relative 1 %   Eosinophils Absolute 0.1 0.0 - 0.5 K/uL   Basophils Relative 0 %   Basophils Absolute 0.1 0.0 - 0.1 K/uL   Immature Granulocytes 1 %   Abs Immature Granulocytes 0.11 (H) 0.00 - 0.07 K/uL  Comprehensive metabolic panel  Result Value Ref Range   Sodium 138 135 - 145 mmol/L   Potassium 3.1 (L) 3.5 - 5.1 mmol/L   Chloride 102 98 - 111 mmol/L   CO2 12 (L) 22 - 32 mmol/L   Glucose, Bld 129 (H) 70 - 99 mg/dL   BUN 13 6 - 20 mg/dL   Creatinine, Ser 7.82 (H) 0.44 - 1.00 mg/dL   Calcium 9.5 8.9 - 95.6 mg/dL   Total Protein 8.3 (H) 6.5 - 8.1 g/dL   Albumin 4.7 3.5 - 5.0 g/dL   AST 27 15 - 41 U/L   ALT 22 0 - 44 U/L   Alkaline Phosphatase 61 38 - 126 U/L   Total Bilirubin 0.4 0.3 - 1.2 mg/dL   GFR, Estimated 50 (L) >60 mL/min   Anion gap 24 (H) 5 -  15  Pregnancy, urine  Result Value Ref Range   Preg Test, Ur NEGATIVE NEGATIVE  D-dimer, quantitative  Result Value Ref Range   D-Dimer, Quant <0.27 0.00 - 0.50 ug/mL-FEU  Troponin I (High Sensitivity)  Result Value Ref Range   Troponin I (High Sensitivity) 6 <18 ng/L  Troponin I (High Sensitivity)  Result Value Ref Range   Troponin I (High Sensitivity) 26 (H) <18 ng/L  Troponin I (High Sensitivity)  Result Value Ref Range   Troponin I (High Sensitivity) 39 (H) <18 ng/L   DG Chest Port 1 View  Result Date: 03/23/2023 CLINICAL DATA:  Shortness of breath with chest tightness. EXAM: PORTABLE CHEST 1 VIEW COMPARISON:  03/14/2017. FINDINGS: The heart size and mediastinal contours are within normal limits. Both lungs are clear. No acute osseous abnormality. IMPRESSION: No active disease. Electronically Signed   By: Thornell Sartorius M.D.   On: 03/23/2023 04:05    Physical Exam  BP 122/68  Pulse 83   Temp 98.6 F (37 C) (Oral)   Resp 18   Ht 5\' 3"  (1.6 m)   Wt 109 kg   SpO2 100%   BMI 42.57 kg/m   Physical Exam Vitals and nursing note reviewed.  Constitutional:      General: She is not in acute distress.    Appearance: Normal appearance. She is not ill-appearing or diaphoretic.  Cardiovascular:     Rate and Rhythm: Normal rate and regular rhythm.  Pulmonary:     Effort: Pulmonary effort is normal.  Skin:    General: Skin is warm and dry.     Capillary Refill: Capillary refill takes less than 2 seconds.  Neurological:     Mental Status: She is alert. Mental status is at baseline.  Psychiatric:        Mood and Affect: Mood normal.        Behavior: Behavior normal.     Procedures  Procedures  ED Course / MDM    Medical Decision Making Amount and/or Complexity of Data Reviewed Labs: ordered. Radiology: ordered.  Risk Prescription drug management.   Upon reassessment, patient is resting comfortably in bed.  She has no current complaints and states that she is  feeling much better.  She is not complaining of chest pain, shortness of breath, or increased feelings of anxiousness.   3rd troponin returned increased to 36.  I spoke with on-call cardiologist Dr. Nelly Laurence who states patient does not need further observation in the ED or repeat troponin.  Patient is appropriate for discharge home and outpatient PCP follow up.   The patient has been appropriately medically screened and/or stabilized in the ED. I have low suspicion for any other emergent medical condition which would require further screening, evaluation or treatment in the ED or require inpatient management. At time of discharge the patient is hemodynamically stable and in no acute distress. I have discussed work-up results and diagnosis with patient and answered all questions. Patient is agreeable with discharge plan. We discussed strict return precautions for returning to the emergency department and they verbalized understanding.          Melton Alar R, PA-C 03/23/23 0929    Rexford Maus, DO 03/23/23 [phone]
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
## 2023-03-23 NOTE — ED Notes (Signed)
Provided pt water.  °

## 2023-04-04 ENCOUNTER — Emergency Department (HOSPITAL_COMMUNITY)
Admission: EM | Admit: 2023-04-04 | Discharge: 2023-04-04 | Disposition: A | Discharge status: Home / Self Care | Payer: MEDICAID | Attending: Emergency Medicine | Admitting: Emergency Medicine

## 2023-04-04 DIAGNOSIS — N926 Irregular menstruation, unspecified: Secondary | ICD-10-CM | POA: Insufficient documentation

## 2023-04-04 DIAGNOSIS — N939 Abnormal uterine and vaginal bleeding, unspecified: Secondary | ICD-10-CM | POA: Diagnosis present

## 2023-04-04 LAB — COMPREHENSIVE METABOLIC PANEL
ALT: 20 U/L (ref 0–44)
AST: 20 U/L (ref 15–41)
Albumin: 3.9 g/dL (ref 3.5–5.0)
Alkaline Phosphatase: 63 U/L (ref 38–126)
Anion gap: 10 (ref 5–15)
BUN: 12 mg/dL (ref 6–20)
CO2: 23 mmol/L (ref 22–32)
Calcium: 8.8 mg/dL — ABNORMAL LOW (ref 8.9–10.3)
Chloride: 104 mmol/L (ref 98–111)
Creatinine, Ser: 0.8 mg/dL (ref 0.44–1.00)
GFR, Estimated: >60 mL/min (ref >60)
Glucose, Bld: 98 mg/dL (ref 70–99)
Potassium: 3.8 mmol/L (ref 3.5–5.1)
Sodium: 137 mmol/L (ref 135–145)
Total Bilirubin: 0.2 mg/dL — ABNORMAL LOW (ref 0.3–1.2)
Total Protein: 7.4 g/dL (ref 6.5–8.1)

## 2023-04-04 LAB — CBC WITH DIFFERENTIAL/PLATELET
Abs Immature Granulocytes: 0.06 10*3/uL (ref 0.00–0.07)
Basophils Absolute: 0.1 10*3/uL (ref 0.0–0.1)
Basophils Relative: 1 %
Eosinophils Absolute: 0.2 10*3/uL (ref 0.0–0.5)
Eosinophils Relative: 2 %
HCT: 41.5 % (ref 36.0–46.0)
Hemoglobin: 13.3 g/dL (ref 12.0–15.0)
Immature Granulocytes: 1 %
Lymphocytes Relative: 23 %
Lymphs Abs: 2.9 10*3/uL (ref 0.7–4.0)
MCH: 26.8 pg (ref 26.0–34.0)
MCHC: 32 g/dL (ref 30.0–36.0)
MCV: 83.7 fL (ref 80.0–100.0)
Monocytes Absolute: 0.8 10*3/uL (ref 0.1–1.0)
Monocytes Relative: 7 %
Neutro Abs: 8.4 10*3/uL — ABNORMAL HIGH (ref 1.7–7.7)
Neutrophils Relative %: 66 %
Platelets: 233 10*3/uL (ref 150–400)
RBC: 4.96 MIL/uL (ref 3.87–5.11)
RDW: 15.6 % — ABNORMAL HIGH (ref 11.5–15.5)
WBC: 12.4 10*3/uL — ABNORMAL HIGH (ref 4.0–10.5)
nRBC: 0 % (ref 0.0–0.2)

## 2023-04-04 LAB — URINALYSIS, ROUTINE W REFLEX MICROSCOPIC
Bacteria, UA: NONE SEEN
Bilirubin Urine: NEGATIVE
Glucose, UA: NEGATIVE mg/dL
Ketones, ur: NEGATIVE mg/dL
Leukocytes,Ua: NEGATIVE
Nitrite: NEGATIVE
Protein, ur: NEGATIVE mg/dL
RBC / HPF: >50 RBC/hpf (ref 0–5)
Specific Gravity, Urine: 1.02 (ref 1.005–1.030)
pH: 6 (ref 5.0–8.0)

## 2023-04-04 LAB — HCG, SERUM, QUALITATIVE: Preg, Serum: NEGATIVE

## 2023-04-04 NOTE — ED Provider Notes (Signed)
[place] EMERGENCY DEPARTMENT AT [facility] Provider Note   CSN: [id number] Arrival date & time: 04/04/23  1424     History  Chief Complaint  Patient presents with   Vaginal Bleeding    Joanna Fitzgerald is a 22 y.o. female with no significant past medical history presents with concern for heavy vaginal bleeding that started this morning.  She also has noted some passing of clots this morning.  She is been going for about 3 tampons in a day.  She states for the past year she has had normal periods every month, but the last 2 months she missed her period.  She notes there is the chance she could be pregnant.  She also notes some mild abdominal cramping associated with the onset of her vaginal bleeding.  Denies any abdominal pain, fever or chills, dizziness.   Vaginal Bleeding      Home Medications Prior to Admission medications   Medication Sig Start Date End Date Taking? Authorizing Provider  Etonogestrel (NEXPLANON [place]) Inject into the skin.    [provider]  famotidine (PEPCID) 20 MG tablet Take 1 tablet (20 mg total) by mouth 2 (two) times daily. 03/26/22   Rondel Baton, MD  ibuprofen (ADVIL) 800 MG tablet Take 1 tablet (800 mg total) by mouth every 8 (eight) hours as needed (pain). 06/15/22   Zenia Resides, MD  triamcinolone (KENALOG) 0.1 % Apply 1 application topically 2 (two) times daily. 07/11/20   Coralyn Mark, NP      Allergies    Patient has no known allergies.    Review of Systems   Review of Systems  Genitourinary:  Positive for vaginal bleeding.    Physical Exam Updated Vital Signs BP (!) 156/87 (BP Location: Right Arm)   Pulse 98   Temp 98.8 F (37.1 C) (Oral)   Resp 16   Ht 5\' 3"  (1.6 m)   Wt 113.4 kg   LMP  (Within Months)   SpO2 98%   BMI 44.29 kg/m  Physical Exam Vitals and nursing note reviewed. Exam conducted with a chaperone present.  Constitutional:      General: She is not in acute distress.     Appearance: She is well-developed.  HENT:     Head: Normocephalic and atraumatic.  Eyes:     Conjunctiva/sclera: Conjunctivae normal.  Cardiovascular:     Rate and Rhythm: Normal rate and regular rhythm.     Heart sounds: No murmur heard. Pulmonary:     Effort: Pulmonary effort is normal. No respiratory distress.     Breath sounds: Normal breath sounds.  Abdominal:     Palpations: Abdomen is soft.     Tenderness: There is no abdominal tenderness.  Genitourinary:    General: Normal vulva.     Comments: Vaginal canal with bleeding, cervical os is closed. Musculoskeletal:        General: No swelling.     Cervical back: Neck supple.  Skin:    General: Skin is warm and dry.     Capillary Refill: Capillary refill takes less than 2 seconds.  Neurological:     Mental Status: She is alert.  Psychiatric:        Mood and Affect: Mood normal.     ED Results / Procedures / Treatments   Labs (all labs ordered are listed, but only abnormal results are displayed) Labs Reviewed  CBC WITH DIFFERENTIAL/PLATELET - Abnormal; Notable for the following components:  Result Value   WBC 12.4 (*)    RDW 15.6 (*)    Neutro Abs 8.4 (*)    All other components within normal limits  URINALYSIS, ROUTINE W REFLEX MICROSCOPIC - Abnormal; Notable for the following components:   APPearance HAZY (*)    Hgb urine dipstick LARGE (*)    All other components within normal limits  WET PREP, GENITAL  HCG, SERUM, QUALITATIVE  COMPREHENSIVE METABOLIC PANEL  HIV ANTIBODY (ROUTINE TESTING W REFLEX)  RPR  GC/CHLAMYDIA PROBE AMP ([place]) NOT AT [facility]    EKG None  Radiology No results found.  Procedures Procedures    Medications Ordered in ED Medications - No data to display  ED Course/ Medical Decision Making/ A&P                                 Medical Decision Making  22 y.o. female with no significant past medical history presents to the ED for concern of heavy vaginal bleeding for  the past 2 days  Differential diagnosis includes but is not limited to abnormal uterine bleeding, pregnancy, threatened abortion, inevitable abortion, menstrual period  ED Course:  Patient presents with concern for heavy normal vaginal bleeding for the past 2 days.  She states she missed her period the past 2 months and this period is heavier than she recalls.  She notes passing some clots, however on vaginal exam I did not see any clots.  There is a mild amount of blood in the vaginal vault.  The cervical os is closed.  Serum pregnancy was negative.  She does not have any anemia.  Urinalysis without any signs of UTI.  Upon completion of the pelvic exam, patient states she has concern for STDs and would like to be tested.  Discussed that she can self swab for gonorrhea chlamydia and we can send off the HIV and syphilis testing.  She will be contacted about the results if they are positive.  She understands if her results are positive that both her and her partner will need treatment.  She does not have any pelvic pain, no known exposure, do not feel she needs prophylactic treatment at this time.  Vital signs stable except for a elevated blood pressure of 156/87. I feel her vaginal bleeding is consistent with a menstrual period. No concern for acute blood loss anemia or pregnancy related concerns at this time. Patient appropriate for discharge home. Discussed with patient that this is just likely return of her menstrual period.  She does not have a PCP or gynecologist.  I encouraged her to schedule an appointment with either PCP or cardiologist to discuss her menstrual cycles and to get regular gynecologic related checkups like Pap smears.  Information was given on her discharge paperwork.   Impression: Menstrual bleeding  Disposition:  The patient was discharged home with instructions to establish care with a PCP or gynecologist for further gynecologic care.  Ibuprofen as needed for pain.  Discussed  that she will be contacted if her STD results are positive. Return precautions given.  Lab Tests: I Ordered, and personally interpreted labs.  The pertinent results include:   CBC without anemia Serum hCG negative Urinalysis without any concern for STIs    Social Determinants of Health:  impaired access to primary care             Final Clinical Impression(s) / ED Diagnoses Final diagnoses:  Irregular  menstrual bleeding    Rx / DC Orders ED Discharge Orders     None         Arabella Merles, Cordelia Poche 04/04/23 1717    Alvira Monday, MD 04/05/23 2243

## 2023-04-04 NOTE — ED Triage Notes (Signed)
Pt states that for the last two months she has not had a period which is abnormal, but that it started today and she began having some vaginal bleeding with larger clot and lower abdominal cramping. Pt denies taking pregnancy test over the last two months. No prior pregnancies.

## 2023-04-04 NOTE — Discharge Instructions (Addendum)
Please schedule an appointment to establish care with either a primary care provider of your choice or gynecologist for further gynecologic care. A gynecology office is listed below, however you may choose whatever practice is best for you. You must call to make this appointment. You will need to establish this for regular gynecologic care including Pap smears and to discuss your irregular periods if this continues.  You have been tested for STDs today.  You will be contacted if your results are positive.  If they are positive, you and your partner will need treatment.  You may use up to 800mg  ibuprofen every 6 hours as needed for pain.  Please return the ER if you develop any fever or chills, severe abdominal pain, dizziness, any other new or concerning symptoms

## 2023-04-05 LAB — GC/CHLAMYDIA PROBE AMP (~~LOC~~) NOT AT ARMC
Chlamydia: NEGATIVE
Comment: NEGATIVE
Comment: NORMAL
Neisseria Gonorrhea: NEGATIVE

## 2023-05-02 ENCOUNTER — Ambulatory Visit: Payer: MEDICAID | Admitting: Nurse Practitioner

## 2023-06-13 ENCOUNTER — Other Ambulatory Visit: Payer: Self-pay

## 2023-06-13 ENCOUNTER — Emergency Department (HOSPITAL_COMMUNITY): Payer: MEDICAID

## 2023-06-13 ENCOUNTER — Emergency Department (HOSPITAL_COMMUNITY)
Admission: EM | Admit: 2023-06-13 | Discharge: 2023-06-13 | Disposition: A | Discharge status: Home / Self Care | Payer: MEDICAID | Attending: Emergency Medicine | Admitting: Emergency Medicine

## 2023-06-13 ENCOUNTER — Encounter (HOSPITAL_COMMUNITY): Payer: Self-pay | Admitting: Emergency Medicine

## 2023-06-13 DIAGNOSIS — R079 Chest pain, unspecified: Secondary | ICD-10-CM | POA: Diagnosis present

## 2023-06-13 DIAGNOSIS — F17290 Nicotine dependence, other tobacco product, uncomplicated: Secondary | ICD-10-CM | POA: Diagnosis not present

## 2023-06-13 DIAGNOSIS — R0789 Other chest pain: Secondary | ICD-10-CM | POA: Diagnosis not present

## 2023-06-13 LAB — CBC
HCT: 44.3 % (ref 36.0–46.0)
Hemoglobin: 14.1 g/dL (ref 12.0–15.0)
MCH: 27 pg (ref 26.0–34.0)
MCHC: 31.8 g/dL (ref 30.0–36.0)
MCV: 84.9 fL (ref 80.0–100.0)
Platelets: 251 10*3/uL (ref 150–400)
RBC: 5.22 MIL/uL — ABNORMAL HIGH (ref 3.87–5.11)
RDW: 14.7 % (ref 11.5–15.5)
WBC: 14.9 10*3/uL — ABNORMAL HIGH (ref 4.0–10.5)
nRBC: 0 % (ref 0.0–0.2)

## 2023-06-13 LAB — HCG, SERUM, QUALITATIVE: Preg, Serum: NEGATIVE

## 2023-06-13 LAB — BASIC METABOLIC PANEL
Anion gap: 8 (ref 5–15)
BUN: 10 mg/dL (ref 6–20)
CO2: 23 mmol/L (ref 22–32)
Calcium: 8.8 mg/dL — ABNORMAL LOW (ref 8.9–10.3)
Chloride: 104 mmol/L (ref 98–111)
Creatinine, Ser: 0.78 mg/dL (ref 0.44–1.00)
GFR, Estimated: >60 mL/min (ref >60)
Glucose, Bld: 99 mg/dL (ref 70–99)
Potassium: 3.9 mmol/L (ref 3.5–5.1)
Sodium: 135 mmol/L (ref 135–145)

## 2023-06-13 LAB — TROPONIN I (HIGH SENSITIVITY): Troponin I (High Sensitivity): <2 ng/L (ref <18)

## 2023-06-13 MED ORDER — KETOROLAC TROMETHAMINE 30 MG/ML IJ SOLN
15.0000 mg | Freq: Once | INTRAMUSCULAR | Status: AC
  Administered 2023-06-13: 15 mg via INTRAMUSCULAR
  Filled 2023-06-13: qty 1

## 2023-06-13 NOTE — Discharge Instructions (Addendum)
We evaluated you for your chest pain.  Your testing and x-ray was reassuring.  We did not see any signs of a heart attack or other dangerous condition.  Your chest x-ray did not show signs of a pneumonia or collapsed lung.   Please take Tylenol and Motrin for your symptoms at home.  You can take 1000 mg of Tylenol every 6 hours and 600 mg of ibuprofen every 6 hours as needed for your symptoms.  You can take these medicines together as needed, either at the same time, or alternating every 3 hours.  Since you do not have a primary doctor, you can follow-up with [facility].  Please call for an appointment.  There is also phone number on the discharge paperwork that has a number that you can call to help get established with a primary doctor.  If you develop any new or worsening symptoms such as difficulty breathing, severe pain, vomiting, fevers or chills, lightheadedness or fainting, please return to the emergency department.

## 2023-06-13 NOTE — ED Triage Notes (Signed)
Patient arrives ambulatory by POV c/o mid to left sided chest pain onset of 1 am along with shortness of breath. States she was seen here about a month ago for same complaint and had a referral but never followed up.
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
## 2023-07-15 ENCOUNTER — Other Ambulatory Visit: Payer: Self-pay

## 2023-07-15 ENCOUNTER — Encounter (HOSPITAL_COMMUNITY): Payer: Self-pay | Admitting: Emergency Medicine

## 2023-07-15 ENCOUNTER — Ambulatory Visit (HOSPITAL_COMMUNITY)
Admission: EM | Admit: 2023-07-15 | Discharge: 2023-07-15 | Disposition: A | Discharge status: Home / Self Care | Payer: MEDICAID | Attending: Internal Medicine | Admitting: Internal Medicine

## 2023-07-15 DIAGNOSIS — Z113 Encounter for screening for infections with a predominantly sexual mode of transmission: Secondary | ICD-10-CM | POA: Insufficient documentation

## 2023-07-15 DIAGNOSIS — G43809 Other migraine, not intractable, without status migrainosus: Secondary | ICD-10-CM | POA: Insufficient documentation

## 2023-07-15 LAB — HIV ANTIBODY (ROUTINE TESTING W REFLEX): HIV Screen 4th Generation wRfx: NONREACTIVE

## 2023-07-15 MED ORDER — DEXAMETHASONE SODIUM PHOSPHATE 10 MG/ML IJ SOLN
10.0000 mg | Freq: Once | INTRAMUSCULAR | Status: AC
  Administered 2023-07-15: 10 mg via INTRAMUSCULAR

## 2023-07-15 MED ORDER — KETOROLAC TROMETHAMINE 30 MG/ML IJ SOLN
INTRAMUSCULAR | Status: AC
  Filled 2023-07-15: qty 1

## 2023-07-15 MED ORDER — DEXAMETHASONE SODIUM PHOSPHATE 10 MG/ML IJ SOLN
INTRAMUSCULAR | Status: AC
  Filled 2023-07-15: qty 1

## 2023-07-15 MED ORDER — KETOROLAC TROMETHAMINE 30 MG/ML IJ SOLN
30.0000 mg | Freq: Once | INTRAMUSCULAR | Status: AC
  Administered 2023-07-15: 30 mg via INTRAMUSCULAR

## 2023-07-15 NOTE — Discharge Instructions (Signed)
You were given a migraine cocktail today in urgent care.  Do not take any ibuprofen, Advil, Aleve for at least 24 hours following injection.  Go to the emergency department if headache persists or worsens after injections.  STD testing is pending.  We will call if anything is positive.
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
## 2023-07-16 LAB — CERVICOVAGINAL ANCILLARY ONLY
Chlamydia: NEGATIVE
Comment: NEGATIVE
Comment: NEGATIVE
Comment: NORMAL
Neisseria Gonorrhea: NEGATIVE
Trichomonas: NEGATIVE

## 2023-07-16 LAB — RPR: RPR Ser Ql: NONREACTIVE

## 2023-07-24 ENCOUNTER — Encounter: Payer: Self-pay | Admitting: Nurse Practitioner

## 2023-07-24 ENCOUNTER — Ambulatory Visit (INDEPENDENT_AMBULATORY_CARE_PROVIDER_SITE_OTHER): Payer: MEDICAID | Admitting: Nurse Practitioner

## 2023-07-24 VITALS — BP 122/66 | HR 89 | Ht 62.0 in | Wt 256.0 lb

## 2023-07-24 DIAGNOSIS — N926 Irregular menstruation, unspecified: Secondary | ICD-10-CM | POA: Diagnosis not present

## 2023-07-24 DIAGNOSIS — L680 Hirsutism: Secondary | ICD-10-CM | POA: Diagnosis not present

## 2023-07-24 DIAGNOSIS — Z833 Family history of diabetes mellitus: Secondary | ICD-10-CM | POA: Diagnosis not present

## 2023-07-24 DIAGNOSIS — Z6841 Body Mass Index (BMI) 40.0 and over, adult: Secondary | ICD-10-CM

## 2023-07-24 DIAGNOSIS — R42 Dizziness and giddiness: Secondary | ICD-10-CM

## 2023-07-24 NOTE — Progress Notes (Signed)
   Acute Office Visit  Subjective:    Patient ID: Joanna Fitzgerald, female    DOB: [birth year], 22 y.o.   MRN: [id number]   HPI 22 y.o. presents today as new patient for irregular menses. Menarche age 21. Thinks she was regular until she received Nexplanon in 2017. Removed in 2020 and cycles have been irregular since. Sometimes she skips for 2-3 months. Has been more regular the last few months. Bleeding is heavy first day with cramps, then tapers off for total bleeding time of 3 days. Reports significant facial hair. Mother and MGM with diabetes. Mother has history of heavy, prolonged periods but unsure if she has ever been diagnosed with PCOS or fibroids. Feels lightheaded first day of menses sometimes. Negative STD screening 07/15/23. Smokes cigars.   Patient's last menstrual period was 07/10/2023. Period Duration (Days): 3 Period Pattern: (!) Irregular Menstrual Flow: Heavy Menstrual Control: Tampon, Maxi pad Menstrual Control Change Freq (Hours): 2 Dysmenorrhea: (!) Severe Dysmenorrhea Symptoms: Cramping, Headache  Review of Systems  Constitutional: Negative.   Genitourinary:  Positive for menstrual problem.  Skin:        Facial hair  Neurological:  Positive for light-headedness.       Objective:    Physical Exam Constitutional:      Appearance: Normal appearance. She is obese.  Genitourinary:    General: Normal vulva.     Vagina: Normal.     Cervix: Normal.     Uterus: Normal.      BP 122/66   Pulse 89   Ht 5\' 2"  (1.575 m)   Wt 256 lb (116.1 kg)   LMP 07/10/2023   SpO2 99%   BMI 46.82 kg/m  Wt Readings from Last 3 Encounters:  07/24/23 256 lb (116.1 kg)  06/13/23 250 lb (113.4 kg)  04/04/23 250 lb (113.4 kg)        Patient informed chaperone available to be present for breast and/or pelvic exam. Patient has requested no chaperone to be present. Patient has been advised what will be completed during breast and pelvic exam.   Assessment & Plan:   Problem  List Items Addressed This Visit   None Visit Diagnoses     Irregular periods    -  Primary   Relevant Orders   Follicle stimulating hormone   Estradiol   Testos,Total,Free and SHBG (Female)   TSH   Luteinizing hormone   Hirsutism       Relevant Orders   Testos,Total,Free and SHBG (Female)   Family history of diabetes mellitus       Relevant Orders   Hemoglobin A1c   Lightheadedness       Relevant Orders   CBC with Differential/Platelet   Comprehensive metabolic panel   BMI 45.0-49.9, adult (HCC)       Relevant Orders   Hemoglobin A1c      Plan: PCOS workup, TSH, A1c, CBC, CMP. Will return for annual exam/pap smear and to discuss labs.    Olivia Mackie DNP, 2:54 PM 07/24/2023

## 2023-07-29 ENCOUNTER — Ambulatory Visit (HOSPITAL_COMMUNITY): Payer: MEDICAID

## 2023-07-29 LAB — CBC WITH DIFFERENTIAL/PLATELET
Absolute Lymphocytes: 2988 {cells}/uL (ref 850–3900)
Absolute Monocytes: 744 {cells}/uL (ref 200–950)
Basophils Absolute: 50 {cells}/uL (ref 0–200)
Basophils Relative: 0.4 %
Eosinophils Absolute: 112 {cells}/uL (ref 15–500)
Eosinophils Relative: 0.9 %
HCT: 43.7 % (ref 35.0–45.0)
Hemoglobin: 14 g/dL (ref 11.7–15.5)
MCH: 26.8 pg — ABNORMAL LOW (ref 27.0–33.0)
MCHC: 32 g/dL (ref 32.0–36.0)
MCV: 83.6 fL (ref 80.0–100.0)
MPV: 12.6 fL — ABNORMAL HIGH (ref 7.5–12.5)
Monocytes Relative: 6 %
Neutro Abs: 8506 {cells}/uL — ABNORMAL HIGH (ref 1500–7800)
Neutrophils Relative %: 68.6 %
Platelets: 229 10*3/uL (ref 140–400)
RBC: 5.23 10*6/uL — ABNORMAL HIGH (ref 3.80–5.10)
RDW: 13.2 % (ref 11.0–15.0)
Total Lymphocyte: 24.1 %
WBC: 12.4 10*3/uL — ABNORMAL HIGH (ref 3.8–10.8)

## 2023-07-29 LAB — HEMOGLOBIN A1C
Hgb A1c MFr Bld: 5.6 %{Hb} (ref <5.7)
Mean Plasma Glucose: 114 mg/dL
eAG (mmol/L): 6.3 mmol/L

## 2023-07-29 LAB — COMPREHENSIVE METABOLIC PANEL
AG Ratio: 1.5 (calc) (ref 1.0–2.5)
ALT: 18 U/L (ref 6–29)
AST: 16 U/L (ref 10–30)
Albumin: 4.3 g/dL (ref 3.6–5.1)
Alkaline phosphatase (APISO): 57 U/L (ref 31–125)
BUN: 14 mg/dL (ref 7–25)
CO2: 26 mmol/L (ref 20–32)
Calcium: 9.4 mg/dL (ref 8.6–10.2)
Chloride: 105 mmol/L (ref 98–110)
Creat: 0.89 mg/dL (ref 0.50–0.96)
Globulin: 2.9 g/dL (ref 1.9–3.7)
Glucose, Bld: 84 mg/dL (ref 65–99)
Potassium: 4.2 mmol/L (ref 3.5–5.3)
Sodium: 139 mmol/L (ref 135–146)
Total Bilirubin: 0.3 mg/dL (ref 0.2–1.2)
Total Protein: 7.2 g/dL (ref 6.1–8.1)

## 2023-07-29 LAB — TESTOS,TOTAL,FREE AND SHBG (FEMALE)
Free Testosterone: 8.5 pg/mL — ABNORMAL HIGH (ref 0.1–6.4)
Sex Hormone Binding: 19.4 nmol/L (ref 17–124)
Testosterone, Total, LC-MS-MS: 40 ng/dL (ref 2–45)

## 2023-07-29 LAB — ESTRADIOL: Estradiol: 60 pg/mL

## 2023-07-29 LAB — TSH: TSH: 0.73 m[IU]/L

## 2023-07-29 LAB — LUTEINIZING HORMONE: LH: 5.3 m[IU]/mL

## 2023-07-29 LAB — FOLLICLE STIMULATING HORMONE: FSH: 5.9 m[IU]/mL

## 2023-07-30 ENCOUNTER — Other Ambulatory Visit: Payer: Self-pay | Admitting: Nurse Practitioner

## 2023-07-30 DIAGNOSIS — D72828 Other elevated white blood cell count: Secondary | ICD-10-CM

## 2023-07-30 DIAGNOSIS — D72829 Elevated white blood cell count, unspecified: Secondary | ICD-10-CM

## 2023-08-27 ENCOUNTER — Inpatient Hospital Stay: Payer: MEDICAID | Attending: Oncology | Admitting: Oncology

## 2023-09-24 ENCOUNTER — Ambulatory Visit: Payer: MEDICAID | Admitting: Nurse Practitioner

## 2023-10-07 ENCOUNTER — Inpatient Hospital Stay: Payer: MEDICAID

## 2023-10-07 ENCOUNTER — Inpatient Hospital Stay: Payer: MEDICAID | Attending: Oncology | Admitting: Oncology

## 2023-10-07 NOTE — Progress Notes (Deleted)
 [facility]  HEMATOLOGY CLINIC CONSULTATION NOTE    PATIENT NAME: Joanna Fitzgerald   MR#: [id number] DOB: [birth year]  DATE OF SERVICE: 10/07/2023  REFERRING PROVIDER:  ***  Patient Care Team: Pcp, No as PCP - General  REASON FOR CONSULTATION/ CHIEF COMPLAINT:  Evaluation of leukocytosis.   ASSESSMENT & PLAN:  Joanna Fitzgerald is a 23 y.o. female, with a past medical history of ***, was referred to our service for evaluation of leukocytosis.    No problem-specific Assessment & Plan notes found for this encounter.   We will check a CBC with differential today.  We will also send a work-up for leukocytosis, including ESR, CRP, BCR /ABL, peripheral blood flow cytometry.  I have counseled patient on smoking cessation, and offered nicotine patch, patient states that sheis going to try to quit.  Low-dose CT scan for screening of lung cancer is indicated ***   I reviewed lab results and outside records for this visit and discussed relevant results with the patient. Diagnosis, plan of care and treatment options were also discussed in detail with the patient. Opportunity provided to ask questions and answers provided to her apparent satisfaction. Provided instructions to call our clinic with any problems, questions or concerns prior to return visit. I recommended to continue follow-up with PCP and sub-specialists. She verbalized understanding and agreed with the plan. No barriers to learning was detected.  Meryl Crutch, MD  10/07/2023 10:53 AM  [facility] - A DE[facility] [address] [place] [address] Dept: [phone] Dept Fax: [phone]   HISTORY OF PRESENTING ILLNESS:   Joanna Fitzgerald is a 23 y.o. lady with past medical history of abnormal uterine bleeding, dysmenorrhea, depression, was referred to our clinic for further evaluation of leukocytosis.  Discussed the use  of AI scribe software for clinical note transcription with the patient, who gave verbal consent to proceed.   ***She was found to have abnormal CBC from ***  ***She denies recent infection. The last prescription antibiotics was more than 3 months ago  Patient denies sinus congestion, cough, urinary frequency/urgency or dysuria, diarrhea, joint swelling/pain or abnormal skin rash.   ***She had no prior history or diagnosis of cancer. Her age appropriate screening programs are up-to-date.  The patient has no prior diagnosis of autoimmune disease and was not prescribed corticosteroids related products.  *** The patient is a smoker and currently smokes *** pack of cigarettes per day for the last *** years.  MEDICAL HISTORY:  Past Medical History:  Diagnosis Date   Abnormal uterine bleeding    Anxiety    Depression    Dysmenorrhea    Eczema    Headache     SURGICAL HISTORY: No past surgical history on file.  SOCIAL HISTORY: She reports that she has been smoking cigars. She has never used smokeless tobacco. She reports current alcohol use of about 3.0 standard drinks of alcohol per week. She reports that she does not currently use drugs after having used the following drugs: Marijuana. Social History   Socioeconomic History   Marital status: Single    Spouse name: Not on file   Number of children: Not on file   Years of education: Not on file   Highest education level: Not on file  Occupational History   Not on file  Tobacco Use   Smoking status: Every Day    Types: Cigars   Smokeless  tobacco: Never   Tobacco comments:    black & milds  Vaping Use   Vaping status: Some Days   Substances: Nicotine, Flavoring  Substance and Sexual Activity   Alcohol use: Yes    Alcohol/week: 3.0 standard drinks of alcohol    Types: 3 Shots of liquor per week   Drug use: Not Currently    Types: Marijuana   Sexual activity: Yes    Birth control/protection: None  Other Topics Concern    Not on file  Social History Narrative   Not on file   Social Drivers of Health   Financial Resource Strain: Not on file  Food Insecurity: Not on file  Transportation Needs: Not on file  Physical Activity: Not on file  Stress: Not on file  Social Connections: Not on file  Intimate Partner Violence: Not on file    FAMILY HISTORY: Family History  Problem Relation Age of Onset   Diabetes Other    Hypertension Other    Stroke Other    Hypertension Mother     ALLERGIES:  She has no known allergies.  MEDICATIONS:  Current Outpatient Medications  Medication Sig Dispense Refill   ibuprofen (ADVIL) 800 MG tablet Take 1 tablet (800 mg total) by mouth every 8 (eight) hours as needed (pain). 21 tablet 0   No current facility-administered medications for this visit.    REVIEW OF SYSTEMS:    Review of Systems - Oncology  All other pertinent systems were reviewed and were negative except as mentioned above.  PHYSICAL EXAMINATION:  ***     There were no vitals filed for this visit. There were no vitals filed for this visit.  Physical Exam  ***  LABORATORY DATA:   I have reviewed the data as listed.  No results found for any visits on 10/07/23.   RADIOGRAPHIC STUDIES:  No recent pertinent imaging studies available to review.   I spent a total of {CHL ONC TIME VISIT - UEAVW:[id number]} during this encounter with the patient including review of chart and various tests results, discussions about plan of care and coordination of care plan.  No orders of the defined types were placed in this encounter.    Future Appointments  Date Time Provider Department Center  10/07/2023  2:00 PM Jerrod Damiano, Archie Patten, MD [facility] None  10/07/2023  3:00 PM [facility] PHLEBOTOMIST [facility] None     This document was completed utilizing speech recognition software. Grammatical errors, random word insertions, pronoun errors, and incomplete sentences are an occasional consequence of this  system due to software limitations, ambient noise, and hardware issues. Any formal questions or concerns about the content, text or information contained within the body of this dictation should be directly addressed to the provider for clarification.

## 2023-11-05 ENCOUNTER — Telehealth: Payer: Self-pay | Admitting: Oncology

## 2023-11-05 ENCOUNTER — Inpatient Hospital Stay: Payer: MEDICAID

## 2023-11-05 ENCOUNTER — Encounter: Payer: Self-pay | Admitting: Oncology

## 2023-11-05 ENCOUNTER — Inpatient Hospital Stay: Payer: MEDICAID | Attending: Oncology | Admitting: Oncology

## 2023-11-05 VITALS — BP 141/82 | HR 85 | Temp 98.9°F | Resp 18 | Ht 62.0 in | Wt 265.9 lb

## 2023-11-05 DIAGNOSIS — F17210 Nicotine dependence, cigarettes, uncomplicated: Secondary | ICD-10-CM | POA: Diagnosis not present

## 2023-11-05 DIAGNOSIS — D72829 Elevated white blood cell count, unspecified: Secondary | ICD-10-CM

## 2023-11-05 DIAGNOSIS — J029 Acute pharyngitis, unspecified: Secondary | ICD-10-CM | POA: Diagnosis not present

## 2023-11-05 LAB — CBC WITH DIFFERENTIAL (CANCER CENTER ONLY)
Abs Immature Granulocytes: 0.09 10*3/uL — ABNORMAL HIGH (ref 0.00–0.07)
Basophils Absolute: 0.1 10*3/uL (ref 0.0–0.1)
Basophils Relative: 0 %
Eosinophils Absolute: 0.2 10*3/uL (ref 0.0–0.5)
Eosinophils Relative: 1 %
HCT: 43.9 % (ref 36.0–46.0)
Hemoglobin: 14.5 g/dL (ref 12.0–15.0)
Immature Granulocytes: 1 %
Lymphocytes Relative: 18 %
Lymphs Abs: 2.5 10*3/uL (ref 0.7–4.0)
MCH: 27.2 pg (ref 26.0–34.0)
MCHC: 33 g/dL (ref 30.0–36.0)
MCV: 82.4 fL (ref 80.0–100.0)
Monocytes Absolute: 1.2 10*3/uL — ABNORMAL HIGH (ref 0.1–1.0)
Monocytes Relative: 8 %
Neutro Abs: 10.2 10*3/uL — ABNORMAL HIGH (ref 1.7–7.7)
Neutrophils Relative %: 72 %
Platelet Count: 264 10*3/uL (ref 150–400)
RBC: 5.33 MIL/uL — ABNORMAL HIGH (ref 3.87–5.11)
RDW: 14.6 % (ref 11.5–15.5)
WBC Count: 14.2 10*3/uL — ABNORMAL HIGH (ref 4.0–10.5)
nRBC: 0 % (ref 0.0–0.2)

## 2023-11-05 LAB — IRON AND TIBC
Iron: 55 ug/dL (ref 28–170)
Saturation Ratios: 15 % (ref 10.4–31.8)
TIBC: 377 ug/dL (ref 250–450)
UIBC: 322 ug/dL

## 2023-11-05 LAB — CMP (CANCER CENTER ONLY)
ALT: 16 U/L (ref 0–44)
AST: 14 U/L — ABNORMAL LOW (ref 15–41)
Albumin: 4.8 g/dL (ref 3.5–5.0)
Alkaline Phosphatase: 61 U/L (ref 38–126)
Anion gap: 8 (ref 5–15)
BUN: 13 mg/dL (ref 6–20)
CO2: 26 mmol/L (ref 22–32)
Calcium: 9.3 mg/dL (ref 8.9–10.3)
Chloride: 103 mmol/L (ref 98–111)
Creatinine: 0.76 mg/dL (ref 0.44–1.00)
GFR, Estimated: >60 mL/min (ref >60)
Glucose, Bld: 89 mg/dL (ref 70–99)
Potassium: 4.3 mmol/L (ref 3.5–5.1)
Sodium: 137 mmol/L (ref 135–145)
Total Bilirubin: 0.3 mg/dL (ref 0.0–1.2)
Total Protein: 7.9 g/dL (ref 6.5–8.1)

## 2023-11-05 LAB — SEDIMENTATION RATE: Sed Rate: 7 mm/h (ref 0–22)

## 2023-11-05 LAB — LACTATE DEHYDROGENASE: LDH: 205 U/L — ABNORMAL HIGH (ref 98–192)

## 2023-11-05 LAB — C-REACTIVE PROTEIN: CRP: 1.8 mg/dL — ABNORMAL HIGH (ref <1.0)

## 2023-11-05 LAB — FERRITIN: Ferritin: 97 ng/mL (ref 11–307)

## 2023-11-05 MED ORDER — AZITHROMYCIN 250 MG PO TABS: ORAL_TABLET | ORAL | 0 refills | Status: AC

## 2023-11-05 NOTE — Assessment & Plan Note (Addendum)
 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 Differential diagnosis includes chronic inflammation, infection, or a bone marrow disorder. Given the chronicity and lack of other concerning symptoms, a bone marrow disorder is less likely. Smoking is a potential contributing factor due to its inflammatory effects. No evidence of leukemia or other malignancy at this time based on clinical picture.  Labs today showed white count of 14,200 with ANC of 10,200, normal differential.  Hemoglobin 14.5, platelet count 264,000.  CMP unremarkable.  LDH close to normal.  Ferritin normal.  We will check BCR/ABL 1, flow cytometry of peripheral blood for further evaluation of leukocytosis.  ESR and CRP were also checked.  - Advise her to reduce smoking to help decrease inflammation  Arrange phone call visit in 2 weeks to discuss results.  - Schedule follow-up appointment in four months with repeat labs.

## 2023-11-05 NOTE — Assessment & Plan Note (Signed)
 Reports a sore throat with raspy voice and pain localized to one side of the neck. Symptoms suggestive of a possible bacterial infection. - Prescribe Z-Pak (azithromycin) with instructions to take two tablets on the first day followed by one tablet daily until completion

## 2023-11-05 NOTE — Telephone Encounter (Signed)
 Spoke with patient confirming upcoming appointment

## 2023-11-05 NOTE — Progress Notes (Signed)
 [facility]  HEMATOLOGY CLINIC CONSULTATION NOTE    PATIENT NAME: Joanna Fitzgerald   MR#: [id number] DOB: Feb 28, 2001  DATE OF SERVICE: 11/05/2023  REFERRING PROVIDER:  Wyline Beady, NP  Patient Care Team: Pcp, No as PCP - General Olivia Mackie, NP as Nurse Practitioner (Gynecology)  REASON FOR CONSULTATION/ CHIEF COMPLAINT:  Evaluation of leukocytosis.   ASSESSMENT & PLAN:  Joanna Fitzgerald is a 23 y.o. lady with a past medical history of anxiety/depression, abnormal uterine bleeding, was referred to our service for evaluation of leukocytosis.    Leukocytosis Chronic leukocytosis since at least 2015 with white blood count in the range of 12,400-19,500. Differential diagnosis includes chronic inflammation, infection, or a bone marrow disorder. Given the chronicity and lack of other concerning symptoms, a bone marrow disorder is less likely. Smoking is a potential contributing factor due to its inflammatory effects. No evidence of leukemia or other malignancy at this time based on clinical picture.  Labs today showed white count of 14,200 with ANC of 10,200, normal differential.  Hemoglobin 14.5, platelet count 264,000.  CMP unremarkable.  LDH close to normal.  Ferritin normal.  We will check BCR/ABL 1, flow cytometry of peripheral blood for further evaluation of leukocytosis.  ESR and CRP were also checked.  - Advise her to reduce smoking to help decrease inflammation  Arrange phone call visit in 2 weeks to discuss results.  - Schedule follow-up appointment in four months with repeat labs.  Sore throat Reports a sore throat with raspy voice and pain localized to one side of the neck. Symptoms suggestive of a possible bacterial infection. - Prescribe Z-Pak (azithromycin) with instructions to take two tablets on the first day followed by one tablet daily until completion   I reviewed lab results and outside records for this visit and discussed relevant  results with the patient. Diagnosis, plan of care and treatment options were also discussed in detail with the patient. Opportunity provided to ask questions and answers provided to her apparent satisfaction. Provided instructions to call our clinic with any problems, questions or concerns prior to return visit. I recommended to continue follow-up with PCP and sub-specialists. She verbalized understanding and agreed with the plan. No barriers to learning was detected.  Meryl Crutch, MD  11/05/2023 4:32 PM  [facility] [address] [place] [address] Dept: [phone] Dept Fax: [phone]   HISTORY OF PRESENTING ILLNESS:   Joanna Fitzgerald is a 23 y.o. lady with past medical history of abnormal uterine bleeding, dysmenorrhea, depression, was referred to our clinic for further evaluation of leukocytosis.  Discussed the use of AI scribe software for clinical note transcription with the patient, who gave verbal consent to proceed.   On 07/24/2023, labs showed white count of 12,400 with ANC of 8500, normal differential.  Hemoglobin and platelet count were within normal limits.  Previously in October 2024, white count was 14,900 and in August 2024, white count was 19,600.  Given persistent leukocytosis, referral was sent to [place] for further evaluation.  She was referred by her OB/GYN after routine labs revealed a white blood cell count of 12,400 (normal range 4,000-11,000). The patient reports that this is the first time she has sought consultation for this issue, although her white blood cell count has been consistently elevated since at least 2015. She reports frequent infections, including respiratory infections and boils, and has been  feeling generally unwell. She also reports significant night sweats, but denies fevers or chills. The patient is a heavy smoker, consuming five cigars and marijuana  daily, but plans to quit. She also reports concerns about potential polycystic ovary syndrome (PCOS), noting irregular periods and excessive facial hair growth. She has been experiencing a sore throat and believes she may have a respiratory infection.  MEDICAL HISTORY:  Past Medical History:  Diagnosis Date   Abnormal uterine bleeding    Anxiety    Depression    Dysmenorrhea    Eczema    Headache     SURGICAL HISTORY: History reviewed. No pertinent surgical history.  SOCIAL HISTORY: She reports that she has been smoking cigars. She has never used smokeless tobacco. She reports current alcohol use of about 3.0 standard drinks of alcohol per week. She reports that she does not currently use drugs after having used the following drugs: Marijuana. Social History   Socioeconomic History   Marital status: Single    Spouse name: Not on file   Number of children: Not on file   Years of education: Not on file   Highest education level: Not on file  Occupational History   Not on file  Tobacco Use   Smoking status: Every Day    Types: Cigars   Smokeless tobacco: Never   Tobacco comments:    black & milds- 5 or more cigars per day  Vaping Use   Vaping status: Some Days   Substances: Nicotine, Flavoring  Substance and Sexual Activity   Alcohol use: Yes    Alcohol/week: 3.0 standard drinks of alcohol    Types: 3 Shots of liquor per week   Drug use: Not Currently    Types: Marijuana   Sexual activity: Yes    Birth control/protection: None  Other Topics Concern   Not on file  Social History Narrative   Not on file   Social Drivers of Health   Financial Resource Strain: Not on file  Food Insecurity: No Food Insecurity (11/05/2023)   Hunger Vital Sign    Worried About Running Out of Food in the Last Year: Never true    Ran Out of Food in the Last Year: Never true  Recent Concern: Food Insecurity - Food Insecurity Present (11/05/2023)   Hunger Vital Sign    Worried About Running  Out of Food in the Last Year: Sometimes true    Ran Out of Food in the Last Year: Sometimes true  Transportation Needs: Unmet Transportation Needs (11/05/2023)   PRAPARE - Administrator, Civil Service (Medical): No    Lack of Transportation (Non-Medical): Yes  Physical Activity: Not on file  Stress: Not on file  Social Connections: Not on file  Intimate Partner Violence: Not At Risk (11/05/2023)   Humiliation, Afraid, Rape, and Kick questionnaire    Fear of Current or Ex-Partner: No    Emotionally Abused: No    Physically Abused: No    Sexually Abused: No    FAMILY HISTORY: Family History  Problem Relation Age of Onset   Diabetes Other    Hypertension Other    Stroke Other    Hypertension Mother     ALLERGIES:  She has no known allergies.  MEDICATIONS:  Current Outpatient Medications  Medication Sig Dispense Refill   acetaminophen (TYLENOL) 500 MG tablet Take 1,000 mg by mouth every 6 (six) hours as needed for moderate pain (pain score 4-6).     azithromycin (ZITHROMAX) 250 MG tablet  Take 2 tablets by mouth on day 1, then 1 tablet daily until complete. 6 each 0   ibuprofen (ADVIL) 800 MG tablet Take 1 tablet (800 mg total) by mouth every 8 (eight) hours as needed (pain). (Patient not taking: Reported on 11/05/2023) 21 tablet 0   No current facility-administered medications for this visit.    REVIEW OF SYSTEMS:    Review of Systems  HENT:   Positive for sore throat and trouble swallowing.     All other pertinent systems were reviewed and were negative except as mentioned above.  PHYSICAL EXAMINATION:    Onc Performance Status - 11/05/23 1407       ECOG Perf Status   ECOG Perf Status Fully active, able to carry on all pre-disease performance without restriction      KPS SCALE   KPS % SCORE Normal, no compliants, no evidence of disease              Vitals:   11/05/23 1401 11/05/23 1402  BP: (!) 141/79 (!) 141/82  Pulse: 85   Resp: 18   Temp:  98.9 F (37.2 C)   SpO2: 96%    Filed Weights   11/05/23 1401  Weight: 265 lb 14.4 oz (120.6 kg)    Physical Exam Constitutional:      General: She is not in acute distress.    Appearance: Normal appearance.  HENT:     Head: Normocephalic and atraumatic.  Eyes:     General: No scleral icterus.    Conjunctiva/sclera: Conjunctivae normal.  Cardiovascular:     Rate and Rhythm: Normal rate and regular rhythm.     Heart sounds: Normal heart sounds.  Pulmonary:     Effort: Pulmonary effort is normal.     Breath sounds: Normal breath sounds.  Abdominal:     General: There is no distension.  Musculoskeletal:     Right lower leg: No edema.     Left lower leg: No edema.  Neurological:     General: No focal deficit present.     Mental Status: She is alert and oriented to person, place, and time.  Psychiatric:        Mood and Affect: Mood normal.        Behavior: Behavior normal.        Thought Content: Thought content normal.      LABORATORY DATA:   I have reviewed the data as listed.  Results for orders placed or performed in visit on 11/05/23  Lactate dehydrogenase  Result Value Ref Range   LDH 205 (H) 98 - 192 U/L  Ferritin  Result Value Ref Range   Ferritin 97 11 - 307 ng/mL  CMP (Cancer Center only)  Result Value Ref Range   Sodium 137 135 - 145 mmol/L   Potassium 4.3 3.5 - 5.1 mmol/L   Chloride 103 98 - 111 mmol/L   CO2 26 22 - 32 mmol/L   Glucose, Bld 89 70 - 99 mg/dL   BUN 13 6 - 20 mg/dL   Creatinine 0.45 4.09 - 1.00 mg/dL   Calcium 9.3 8.9 - 81.1 mg/dL   Total Protein 7.9 6.5 - 8.1 g/dL   Albumin 4.8 3.5 - 5.0 g/dL   AST 14 (L) 15 - 41 U/L   ALT 16 0 - 44 U/L   Alkaline Phosphatase 61 38 - 126 U/L   Total Bilirubin 0.3 0.0 - 1.2 mg/dL   GFR, Estimated >91 >47 mL/min   Anion gap 8  5 - 15  CBC with Differential (Cancer Center Only)  Result Value Ref Range   WBC Count 14.2 (H) 4.0 - 10.5 K/uL   RBC 5.33 (H) 3.87 - 5.11 MIL/uL   Hemoglobin 14.5 12.0  - 15.0 g/dL   HCT 16.1 09.6 - 04.5 %   MCV 82.4 80.0 - 100.0 fL   MCH 27.2 26.0 - 34.0 pg   MCHC 33.0 30.0 - 36.0 g/dL   RDW 40.9 81.1 - 91.4 %   Platelet Count 264 150 - 400 K/uL   nRBC 0.0 0.0 - 0.2 %   Neutrophils Relative % 72 %   Neutro Abs 10.2 (H) 1.7 - 7.7 K/uL   Lymphocytes Relative 18 %   Lymphs Abs 2.5 0.7 - 4.0 K/uL   Monocytes Relative 8 %   Monocytes Absolute 1.2 (H) 0.1 - 1.0 K/uL   Eosinophils Relative 1 %   Eosinophils Absolute 0.2 0.0 - 0.5 K/uL   Basophils Relative 0 %   Basophils Absolute 0.1 0.0 - 0.1 K/uL   Immature Granulocytes 1 %   Abs Immature Granulocytes 0.09 (H) 0.00 - 0.07 K/uL     RADIOGRAPHIC STUDIES:  No recent pertinent imaging studies available to review.   I spent a total of 55 minutes during this encounter with the patient including review of chart and various tests results, discussions about plan of care and coordination of care plan.  Orders Placed This Encounter  Procedures   BCR-ABL1 FISH    Standing Status:   Future    Number of Occurrences:   1    Expiration Date:   11/04/2024   C-reactive protein    Standing Status:   Future    Number of Occurrences:   1    Expiration Date:   11/04/2024   CBC with Differential (Cancer Center Only)    Standing Status:   Future    Number of Occurrences:   1    Expiration Date:   11/04/2024   CMP (Cancer Center only)    Standing Status:   Future    Number of Occurrences:   1    Expiration Date:   11/04/2024   Ferritin    Standing Status:   Future    Number of Occurrences:   1    Expiration Date:   11/04/2024   Flow Cytometry, Peripheral Blood (Oncology)    Standing Status:   Future    Number of Occurrences:   1    Expiration Date:   11/04/2024   Iron and TIBC    Standing Status:   Future    Number of Occurrences:   1    Expiration Date:   11/04/2024   Lactate dehydrogenase    Standing Status:   Future    Number of Occurrences:   1    Expiration Date:   11/04/2024   Sedimentation rate     Standing Status:   Future    Number of Occurrences:   1    Expiration Date:   11/04/2024     No future appointments.    This document was completed utilizing speech recognition software. Grammatical errors, random word insertions, pronoun errors, and incomplete sentences are an occasional consequence of this system due to software limitations, ambient noise, and hardware issues. Any formal questions or concerns about the content, text or information contained within the body of this dictation should be directly addressed to the provider for clarification.

## 2023-11-08 LAB — BCR-ABL1 FISH
Cells Analyzed: 200
Cells Counted: 200

## 2023-11-19 ENCOUNTER — Encounter: Payer: Self-pay | Admitting: Oncology

## 2023-11-19 ENCOUNTER — Inpatient Hospital Stay: Payer: MEDICAID | Attending: Oncology | Admitting: Oncology

## 2023-11-19 DIAGNOSIS — D72829 Elevated white blood cell count, unspecified: Secondary | ICD-10-CM | POA: Diagnosis not present

## 2023-11-19 NOTE — Progress Notes (Signed)
 [facility]  HEMATOLOGY-ONCOLOGY ELECTRONIC VISIT PROGRESS NOTE  PATIENT NAME: Joanna Fitzgerald   MR#: [id number] DOB: [birth year]  DATE OF SERVICE: 11/19/2023  Patient Care Team: Pcp, No as PCP - General Olivia Mackie, NP as Nurse Practitioner (Gynecology)  I connected with the patient via telephone conference and verified that I am speaking with the correct person using two identifiers. The patient's location is at home and I am providing care from the [facility].  I discussed the limitations, risks, security and privacy concerns of performing an evaluation and management service by e-visits and the availability of in person appointments.  I also discussed with the patient that there may be a patient responsible charge related to this service. The patient expressed understanding and agreed to proceed.   ASSESSMENT & PLAN:   Joanna Fitzgerald is a 23 y.o. lady with a past medical history of anxiety/depression, abnormal uterine bleeding, was referred to our service in March 2025 for evaluation of leukocytosis.    Leukocytosis Chronic leukocytosis since at least 2015 with white blood count in the range of 12,400-19,500. Differential diagnosis includes chronic inflammation, infection, or a bone marrow disorder. Given the chronicity and lack of other concerning symptoms, a bone marrow disorder is less likely. Smoking is a potential contributing factor due to its inflammatory effects. No evidence of leukemia or other malignancy at this time based on clinical picture.  On her initial consultation with [place] on 11/05/2023, labs showed white count of 14,200 with ANC of 10,200, normal differential.  Hemoglobin 14.5, platelet count 264,000.  CMP unremarkable.  LDH close to normal.  Ferritin and iron studies were unremarkable without evidence of iron deficiency.  CRP was slightly evaded at 1.8 mg/dL.  ESR normal at 7 mm/h.  BCR/ABL 1 testing was negative.    - Advise her to reduce  smoking to help decrease inflammation  - Schedule follow-up appointment in four months with repeat labs.   I discussed the assessment and treatment plan with the patient. The patient was provided an opportunity to ask questions and all were answered. The patient agreed with the plan and demonstrated an understanding of the instructions. The patient was advised to call back or seek an in-person evaluation if the symptoms worsen or if the condition fails to improve as anticipated.    I spent 12 minutes over the phone with the patient reviewing test results, discuss management and coordination/planning of care.  Meryl Crutch, MD 11/19/2023 3:52 PM [facility] - A DEPT OF [facility] [address] [place] [address] Dept: [phone] Dept Fax: [phone]   INTERVAL HISTORY:  Please see above for problem oriented charting.  The purpose of today's discussion is to explain recent lab results and to formulate plan of care.  Joanna Fitzgerald denies any new complaints compared to last visit.  Her sore throat has resolved.  Denies any fevers or chills.  SUMMARY OF HEMATOLOGY HISTORY:  23 y.o. lady with past medical history of abnormal uterine bleeding, dysmenorrhea, depression, was referred to our clinic for further evaluation of leukocytosis.   On 07/24/2023, labs showed white count of 12,400 with ANC of 8500, normal differential.  Hemoglobin and platelet count were within normal limits.  Previously in October 2024, white count was 14,900 and in August 2024, white count was 19,600.  Given persistent leukocytosis, referral was sent to [place] for further evaluation.   Joanna Fitzgerald was referred by her OB/GYN  after routine labs revealed a white blood cell count of 12,400 (normal range 4,000-11,000). The patient reports that this is the first time Joanna Fitzgerald has sought consultation for this issue, although her white blood cell count has been consistently  elevated since at least 2015. Joanna Fitzgerald reports frequent infections, including respiratory infections and boils, and has been feeling generally unwell. Joanna Fitzgerald also reports significant night sweats, but denies fevers or chills. The patient is a heavy smoker, consuming five cigars and marijuana daily, but plans to quit. Joanna Fitzgerald also reports concerns about potential polycystic ovary syndrome (PCOS), noting irregular periods and excessive facial hair growth.   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 Differential diagnosis includes chronic inflammation, infection, or a bone marrow disorder. Given the chronicity and lack of other concerning symptoms, a bone marrow disorder is less likely. Smoking is a potential contributing factor due to its inflammatory effects. No evidence of leukemia or other malignancy at this time based on clinical picture.   On her initial consultation with [place] on 11/05/2023, labs showed white count of 14,200 with ANC of 10,200, normal differential.  Hemoglobin 14.5, platelet count 264,000.  CMP unremarkable.  LDH close to normal.  Ferritin and iron studies were unremarkable without evidence of iron deficiency.  CRP was slightly evaded at 1.8 mg/dL.  ESR normal at 7 mm/h.  BCR/ABL 1 testing was negative.    - Advise her to reduce smoking to help decrease inflammation  - Schedule follow-up appointment in four months with repeat labs.   REVIEW OF SYSTEMS:    Review of Systems - Oncology  All other pertinent systems were reviewed with the patient and are negative.  I have reviewed the past medical history, past surgical history, social history and family history with the patient and they are unchanged from previous note.  ALLERGIES:  Joanna Fitzgerald has no known allergies.  MEDICATIONS:  Current Outpatient Medications  Medication Sig Dispense Refill   acetaminophen (TYLENOL) 500 MG tablet Take 1,000 mg by mouth every 6 (six) hours as needed for  moderate pain (pain score 4-6).     azithromycin (ZITHROMAX) 250 MG tablet Take 2 tablets by mouth on day 1, then 1 tablet daily until complete. 6 each 0   ibuprofen (ADVIL) 800 MG tablet Take 1 tablet (800 mg total) by mouth every 8 (eight) hours as needed (pain). (Patient not taking: Reported on 11/05/2023) 21 tablet 0   No current facility-administered medications for this visit.    PHYSICAL EXAMINATION:    Onc Performance Status - 11/19/23 1500       ECOG Perf Status   ECOG Perf Status Fully active, able to carry on all pre-disease performance without restriction      KPS SCALE   KPS % SCORE Normal, no compliants, no evidence of disease             LABORATORY DATA:   I have reviewed the data as listed.  Recent Results (from the past 2160 hours)  Sedimentation rate     Status: None   Collection Time: 11/05/23  3:15 PM  Result Value Ref Range   Sed Rate 7 0 - 22 mm/hr    Comment: Performed at [facility], [address] [place], [place] [address]  Lactate dehydrogenase     Status: Abnormal   Collection Time: 11/05/23  3:15 PM  Result Value Ref Range   LDH 205 (H) 98 - 192 U/L    Comment: Performed  at [facility], [address], [place], [place] [address]  Iron and TIBC     Status: None   Collection Time: 11/05/23  3:15 PM  Result Value Ref Range   Iron 55 28 - 170 ug/dL   TIBC 604 540 - 981 ug/dL   Saturation Ratios 15 10.4 - 31.8 %   UIBC 322 ug/dL    Comment: Performed at [facility], [address], [place], [place] [address]  Ferritin     Status: None   Collection Time: 11/05/23  3:15 PM  Result Value Ref Range   Ferritin 97 11 - 307 ng/mL    Comment: Performed at [facility], [address], [place], [place] [address]  CMP (Cancer Center only)     Status: Abnormal   Collection Time: 11/05/23  3:15 PM  Result Value Ref Range   Sodium 137 135 - 145 mmol/L   Potassium 4.3 3.5 - 5.1  mmol/L   Chloride 103 98 - 111 mmol/L   CO2 26 22 - 32 mmol/L   Glucose, Bld 89 70 - 99 mg/dL    Comment: Glucose reference range applies only to samples taken after fasting for at least 8 hours.   BUN 13 6 - 20 mg/dL   Creatinine 2.13 0.86 - 1.00 mg/dL   Calcium 9.3 8.9 - 57.8 mg/dL   Total Protein 7.9 6.5 - 8.1 g/dL   Albumin 4.8 3.5 - 5.0 g/dL   AST 14 (L) 15 - 41 U/L   ALT 16 0 - 44 U/L   Alkaline Phosphatase 61 38 - 126 U/L   Total Bilirubin 0.3 0.0 - 1.2 mg/dL   GFR, Estimated >46 >96 mL/min    Comment: (NOTE) Calculated using the CKD-EPI Creatinine Equation (2021)    Anion gap 8 5 - 15    Comment: Performed at [facility], [address] [place], [place] [address]  CBC with Differential (Cancer Center Only)     Status: Abnormal   Collection Time: 11/05/23  3:15 PM  Result Value Ref Range   WBC Count 14.2 (H) 4.0 - 10.5 K/uL   RBC 5.33 (H) 3.87 - 5.11 MIL/uL   Hemoglobin 14.5 12.0 - 15.0 g/dL   HCT 41.3 24.4 - 01.0 %   MCV 82.4 80.0 - 100.0 fL   MCH 27.2 26.0 - 34.0 pg   MCHC 33.0 30.0 - 36.0 g/dL   RDW 27.2 53.6 - 64.4 %   Platelet Count 264 150 - 400 K/uL   nRBC 0.0 0.0 - 0.2 %   Neutrophils Relative % 72 %   Neutro Abs 10.2 (H) 1.7 - 7.7 K/uL   Lymphocytes Relative 18 %   Lymphs Abs 2.5 0.7 - 4.0 K/uL   Monocytes Relative 8 %   Monocytes Absolute 1.2 (H) 0.1 - 1.0 K/uL   Eosinophils Relative 1 %   Eosinophils Absolute 0.2 0.0 - 0.5 K/uL   Basophils Relative 0 %   Basophils Absolute 0.1 0.0 - 0.1 K/uL   Immature Granulocytes 1 %   Abs Immature Granulocytes 0.09 (H) 0.00 - 0.07 K/uL    Comment: Performed at [facility], [address] [place], [place] [address]  C-reactive protein     Status: Abnormal   Collection Time: 11/05/23  3:15 PM  Result Value Ref Range   CRP 1.8 (H) <1.0 mg/dL    Comment: Performed at [facility], [address], [place], [place] [address]  BCR-ABL1  FISH     Status: None    Collection Time: 11/05/23  3:15 PM  Result Value Ref Range   Specimen Type BLOOD    Cells Counted 200    Cells Analyzed 200    FISH Result ABL1 GENE FUSION     Comment: Comment: NO BCR    Interpretation Comment:     Comment: (NOTE) NEGATIVE             nuc ish 9q34(ASS1,ABL1)x2,22q11.2(BCRx2)[200].      The fluorescence in situ hybridization (FISH) study was normal. FISH, using unique sequence DNA probes for the ABL1 and BCR gene regions showed two ABL1 signals (red), two control ASS1 gene signals (aqua) located adjacent to the ABL1 locus at 9q34, and two BCR signals (green) at 22q11.2 in all interphase nuclei examined. There was NO evidence of CML or ALL-associated BCR::ABL1 dual fusion signals in this analysis. .      This analysis is limited to abnormalities detectable by the specific probes included in the study. FISH results should be interpreted within the context of a full cytogenetic analysis and pathology evaluation.  A BCR::ABL1 gene fusion in greater than 3 interphase nuclei in a patient with a new clinical diagnosis is considered positive. The DNA probe vendor for this study was [employer], community). .      This test was developed and its performance characteristics d etermined by [employer] ([employer]). It has not been cleared or approved by the U.S. Food and Drug Administration.    Director Review: Comment:     Comment: (NOTE) Thurston Hole, PHD, FACMG Performed At: [facility] [address] [place], [place] [id number] Maurine Simmering MDPhD [id number]      RADIOGRAPHIC STUDIES:  No recent pertinent imaging studies available to review.  Orders Placed This Encounter  Procedures   CBC with Differential (Cancer Center Only)    Standing Status:   Future    Expected Date:   03/03/2024    Expiration Date:   11/18/2024   Lactate dehydrogenase    Standing Status:   Future    Expected Date:   03/03/2024     Expiration Date:   11/18/2024   Sedimentation rate    Standing Status:   Future    Expected Date:   03/03/2024    Expiration Date:   11/18/2024   C-reactive protein    Standing Status:   Future    Expected Date:   03/03/2024    Expiration Date:   11/18/2024     Future Appointments  Date Time Provider Department Center  03/03/2024  2:00 PM [facility] PHLEBOTOMIST [facility] None  03/03/2024  2:30 PM Randilyn Foisy, Archie Patten, MD [facility] None    This document was completed utilizing speech recognition software. Grammatical errors, random word insertions, pronoun errors, and incomplete sentences are an occasional consequence of this system due to software limitations, ambient noise, and hardware issues. Any formal questions or concerns about the content, text or information contained within the body of this dictation should be directly addressed to the provider for clarification.

## 2023-11-19 NOTE — Assessment & Plan Note (Signed)
 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 Differential diagnosis includes chronic inflammation, infection, or a bone marrow disorder. Given the chronicity and lack of other concerning symptoms, a bone marrow disorder is less likely. Smoking is a potential contributing factor due to its inflammatory effects. No evidence of leukemia or other malignancy at this time based on clinical picture.  On her initial consultation with [place] on 11/05/2023, labs showed white count of 14,200 with ANC of 10,200, normal differential.  Hemoglobin 14.5, platelet count 264,000.  CMP unremarkable.  LDH close to normal.  Ferritin and iron studies were unremarkable without evidence of iron deficiency.  CRP was slightly evaded at 1.8 mg/dL.  ESR normal at 7 mm/h.  BCR/ABL 1 testing was negative.    - Advise her to reduce smoking to help decrease inflammation  - Schedule follow-up appointment in four months with repeat labs.

## 2023-11-28 ENCOUNTER — Encounter (HOSPITAL_COMMUNITY): Payer: Self-pay | Admitting: Emergency Medicine

## 2023-11-28 ENCOUNTER — Ambulatory Visit (HOSPITAL_COMMUNITY)
Admission: EM | Admit: 2023-11-28 | Discharge: 2023-11-28 | Disposition: A | Discharge status: Home / Self Care | Payer: MEDICAID

## 2023-11-28 ENCOUNTER — Emergency Department (HOSPITAL_COMMUNITY): Admission: EM | Admit: 2023-11-28 | Discharge: 2023-11-28 | Discharge status: Against Medical Advice | Payer: MEDICAID

## 2023-11-28 DIAGNOSIS — N939 Abnormal uterine and vaginal bleeding, unspecified: Secondary | ICD-10-CM | POA: Diagnosis not present

## 2023-11-28 LAB — POCT URINE PREGNANCY: Preg Test, Ur: NEGATIVE

## 2023-11-28 MED ORDER — MEGESTROL ACETATE 40 MG PO TABS
40.0000 mg | ORAL_TABLET | Freq: Two times a day (BID) | ORAL | 0 refills | Status: AC
Start: 2023-11-28 — End: 2023-12-12

## 2023-11-28 NOTE — Discharge Instructions (Addendum)
 We have sent in a medication to help with the vaginal bleeding.  We recommend calling your OB/GYN tomorrow to schedule a follow-up appointment.  Please go to the emergency department if you develop severe abdominal pain, worsening of symptoms, dizziness, loss of consciousness, fever, worsening bleeding, or if you have any other concerns.
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
## 2023-11-28 NOTE — ED Triage Notes (Addendum)
 Pt reports that her menstrual cycle started on 4/8. Reports heavy bleeding and feeling lightheaded. Reports changing pads 5 times in hour. Taking tylenol. Pt reports that she did see GYN end of March and had a tele-visit with them 4/1 for her heavy menstrual cycles.

## 2023-11-28 NOTE — ED Notes (Signed)
 Pt left AMA

## 2023-12-05 ENCOUNTER — Other Ambulatory Visit: Payer: Self-pay

## 2023-12-05 ENCOUNTER — Emergency Department (HOSPITAL_COMMUNITY)
Admission: EM | Admit: 2023-12-05 | Discharge: 2023-12-05 | Disposition: A | Discharge status: Home / Self Care | Payer: MEDICAID | Attending: Emergency Medicine | Admitting: Emergency Medicine

## 2023-12-05 ENCOUNTER — Encounter (HOSPITAL_COMMUNITY): Payer: Self-pay

## 2023-12-05 DIAGNOSIS — R109 Unspecified abdominal pain: Secondary | ICD-10-CM | POA: Insufficient documentation

## 2023-12-05 DIAGNOSIS — R197 Diarrhea, unspecified: Secondary | ICD-10-CM | POA: Insufficient documentation

## 2023-12-05 DIAGNOSIS — R059 Cough, unspecified: Secondary | ICD-10-CM | POA: Insufficient documentation

## 2023-12-05 DIAGNOSIS — R112 Nausea with vomiting, unspecified: Secondary | ICD-10-CM | POA: Insufficient documentation

## 2023-12-05 DIAGNOSIS — R509 Fever, unspecified: Secondary | ICD-10-CM | POA: Diagnosis not present

## 2023-12-05 LAB — URINALYSIS, ROUTINE W REFLEX MICROSCOPIC
Bacteria, UA: NONE SEEN
Bilirubin Urine: NEGATIVE
Glucose, UA: NEGATIVE mg/dL
Ketones, ur: NEGATIVE mg/dL
Leukocytes,Ua: NEGATIVE
Nitrite: NEGATIVE
Protein, ur: NEGATIVE mg/dL
Specific Gravity, Urine: 1.016 (ref 1.005–1.030)
pH: 5 (ref 5.0–8.0)

## 2023-12-05 LAB — COMPREHENSIVE METABOLIC PANEL WITH GFR
ALT: 16 U/L (ref 0–44)
AST: 15 U/L (ref 15–41)
Albumin: 3.9 g/dL (ref 3.5–5.0)
Alkaline Phosphatase: 50 U/L (ref 38–126)
Anion gap: 6 (ref 5–15)
BUN: 9 mg/dL (ref 6–20)
CO2: 22 mmol/L (ref 22–32)
Calcium: 8.9 mg/dL (ref 8.9–10.3)
Chloride: 106 mmol/L (ref 98–111)
Creatinine, Ser: 0.7 mg/dL (ref 0.44–1.00)
GFR, Estimated: >60 mL/min (ref >60)
Glucose, Bld: 90 mg/dL (ref 70–99)
Potassium: 4 mmol/L (ref 3.5–5.1)
Sodium: 134 mmol/L — ABNORMAL LOW (ref 135–145)
Total Bilirubin: 0.4 mg/dL (ref 0.0–1.2)
Total Protein: 7.2 g/dL (ref 6.5–8.1)

## 2023-12-05 LAB — CBC
HCT: 44.2 % (ref 36.0–46.0)
Hemoglobin: 13.7 g/dL (ref 12.0–15.0)
MCH: 26.6 pg (ref 26.0–34.0)
MCHC: 31 g/dL (ref 30.0–36.0)
MCV: 85.7 fL (ref 80.0–100.0)
Platelets: 233 10*3/uL (ref 150–400)
RBC: 5.16 MIL/uL — ABNORMAL HIGH (ref 3.87–5.11)
RDW: 14.6 % (ref 11.5–15.5)
WBC: 8 10*3/uL (ref 4.0–10.5)
nRBC: 0 % (ref 0.0–0.2)

## 2023-12-05 LAB — LIPASE, BLOOD: Lipase: 22 U/L (ref 11–51)

## 2023-12-05 LAB — HCG, SERUM, QUALITATIVE: Preg, Serum: NEGATIVE

## 2023-12-05 MED ORDER — ACETAMINOPHEN 500 MG PO TABS: 1000.0000 mg | ORAL_TABLET | Freq: Four times a day (QID) | ORAL | 0 refills | Status: DC | PRN

## 2023-12-05 MED ORDER — ONDANSETRON HCL 4 MG PO TABS: 4.0000 mg | ORAL_TABLET | Freq: Three times a day (TID) | ORAL | 0 refills | Status: AC | PRN

## 2023-12-05 MED ORDER — BENZONATATE 100 MG PO CAPS: 100.0000 mg | ORAL_CAPSULE | Freq: Three times a day (TID) | ORAL | 0 refills | Status: AC

## 2023-12-05 NOTE — ED Notes (Addendum)
 Patient in lobby causing a disturbance due to not being called to a room. Thoroughly explained to patient our process of how we call patient's back. Patient then raised her voice with this tech stating that she should not have to wait so long to be seen. Patient stormed off on the phone telling caller that medical staff have been neglecting patients and watching patient's fall in the floor without assisting them to a wheelchair. Triage nurse made aware.

## 2023-12-05 NOTE — Discharge Instructions (Addendum)
 You have symptoms likely due to a viral infection.  Please take medications prescribed as needed for symptom control.  Follow-up closely with your doctor for further care.  Return if you have any concern

## 2023-12-05 NOTE — ED Provider Notes (Signed)
 [place] EMERGENCY DEPARTMENT AT [facility] Provider Note   CSN: [id number] Arrival date & time: 12/05/23  1302     History  Chief Complaint  Patient presents with   Emesis    Joanna Fitzgerald is a 23 y.o. female.  The history is provided by the patient and medical records. No language interpreter was used.  Emesis    23 year old female presenting with cold symptoms.  Patient states for the past 2 days she has had cold symptoms.  She endorsed having nausea vomiting diarrhea abdominal cramping cough, sinus congestion, subjective fever and overall not feeling well.  She initially thought it was her seasonal allergies but when she developed abdominal discomfort she was concerned.  She does not endorse any significant shortness of breath no sore throat no dysuria.  No recent sick contact.  Home Medications Prior to Admission medications   Medication Sig Start Date End Date Taking? Authorizing Provider  acetaminophen (TYLENOL) 500 MG tablet Take 1,000 mg by mouth every 6 (six) hours as needed for moderate pain (pain score 4-6).    [provider]  azithromycin (ZITHROMAX) 250 MG tablet Take 2 tablets by mouth on day 1, then 1 tablet daily until complete. 11/05/23   Pasam, Archie Patten, MD  ibuprofen (ADVIL) 800 MG tablet Take 1 tablet (800 mg total) by mouth every 8 (eight) hours as needed (pain). Patient not taking: Reported on 11/05/2023 06/15/22   Zenia Resides, MD  megestrol (MEGACE) 40 MG tablet Take 1 tablet (40 mg total) by mouth in the morning and at bedtime for 14 days. 11/28/23 12/12/23  Gifford Shave, PA-C      Allergies    Patient has no known allergies.    Review of Systems   Review of Systems  Gastrointestinal:  Positive for vomiting.  All other systems reviewed and are negative.   Physical Exam Updated Vital Signs BP 131/63 (BP Location: Left Arm)   Pulse 82   Temp 98.4 F (36.9 C) (Oral)   Resp 18   Ht 5\' 2"  (1.575 m)   Wt 115.7 kg    LMP 11/26/2023   SpO2 96%   BMI 46.64 kg/m  Physical Exam Vitals and nursing note reviewed.  Constitutional:      General: She is not in acute distress.    Appearance: She is well-developed. She is obese.  HENT:     Head: Atraumatic.     Nose: Nose normal.     Mouth/Throat:     Mouth: Mucous membranes are moist.  Eyes:     Conjunctiva/sclera: Conjunctivae normal.  Cardiovascular:     Rate and Rhythm: Normal rate and regular rhythm.     Pulses: Normal pulses.     Heart sounds: Normal heart sounds.  Pulmonary:     Effort: Pulmonary effort is normal.  Abdominal:     General: Bowel sounds are normal.     Palpations: Abdomen is soft.     Tenderness: There is no abdominal tenderness.  Musculoskeletal:     Cervical back: Neck supple.  Skin:    Findings: No rash.  Neurological:     Mental Status: She is alert.  Psychiatric:        Mood and Affect: Mood normal.    ED Results / Procedures / Treatments   Labs (all labs ordered are listed, but only abnormal results are displayed) Labs Reviewed  COMPREHENSIVE METABOLIC PANEL WITH GFR - Abnormal; Notable for the following components:  Result Value   Sodium 134 (*)    All other components within normal limits  CBC - Abnormal; Notable for the following components:   RBC 5.16 (*)    All other components within normal limits  URINALYSIS, ROUTINE W REFLEX MICROSCOPIC - Abnormal; Notable for the following components:   APPearance HAZY (*)    Hgb urine dipstick SMALL (*)    All other components within normal limits  LIPASE, BLOOD  HCG, SERUM, QUALITATIVE    EKG None  Radiology No results found.  Procedures Procedures    Medications Ordered in ED Medications - No data to display  ED Course/ Medical Decision Making/ A&P                                 Medical Decision Making Amount and/or Complexity of Data Reviewed Labs: ordered.   BP 131/63 (BP Location: Left Arm)   Pulse 82   Temp 98.4 F (36.9 C)  (Oral)   Resp 18   Ht 5\' 2"  (1.575 m)   Wt 115.7 kg   LMP 11/26/2023   SpO2 96%   BMI 46.64 kg/m   54:34 PM  23 year old female presenting with cold symptoms.  Patient states for the past 2 days she has had cold symptoms.  She endorsed having nausea vomiting diarrhea abdominal cramping cough, sinus congestion, subjective fever and overall not feeling well.  She initially thought it was her seasonal allergies but when she developed abdominal discomfort she was concerned.  She does not endorse any significant shortness of breath no sore throat no dysuria.  No recent sick contact.  On exam this is a well-appearing female resting comfortably appears to be in no acute discomfort.  Heart with normal rate and rhythm, lungs are clear to auscultation bilaterally abdomen is soft nontender bowel sounds are present.  She does not exhibit any nuchal rigidity concerning for meningitis.  -Labs ordered, independently viewed and interpreted by me.  Labs remarkable for normal pregnancy test, labs overall reassuring normal WBC, normal H&H, urinalysis without signs of UTI -The patient was maintained on a cardiac monitor.  I personally viewed and interpreted the cardiac monitored which showed an underlying rhythm of: Normal sinus rhythm -Imaging including abdominal pelvis CT scan considered but not performed as patient has a benign abdominal exam -This patient presents to the ED for concern of cold symptoms, this involves an extensive number of treatment options, and is a complaint that carries with it a high risk of complications and morbidity.  The differential diagnosis includes viral illness, pneumonia, UTI, colitis, pancreatitis, appendicitis, pyelonephritis, seasonal allergy -Co morbidities that complicate the patient evaluation includes none -Treatment includes supportive care -Reevaluation of the patient after these medicines showed that the patient improved -PCP office notes or outside notes  reviewed -Escalation to admission/observation considered: patients feels much better, is comfortable with discharge, and will follow up with PCP -Prescription medication considered, patient comfortable with Tessalon for cough, Zofran for nausea, Tylenol for body aches -Social Determinant of Health considered which includes tobacco use, lack of transportation         Final Clinical Impression(s) / ED Diagnoses Final diagnoses:  Nausea vomiting and diarrhea    Rx / DC Orders ED Discharge Orders          Ordered    benzonatate (TESSALON) 100 MG capsule  Every 8 hours        12/05/23 2231  acetaminophen (TYLENOL) 500 MG tablet  Every 6 hours PRN        12/05/23 2231    ondansetron (ZOFRAN) 4 MG tablet  Every 8 hours PRN        12/05/23 2231              Debbra Fairy, PA-C 12/05/23 2245    Onetha Bile, MD 12/05/23 2351

## 2023-12-05 NOTE — ED Triage Notes (Signed)
 Patient has been sick since Tuesday, runny nose, then Wednesday generalized abdominal pain. Last night had a fever of 100F, vomiting, diarrhea. Has a cough.

## 2024-02-25 ENCOUNTER — Inpatient Hospital Stay: Payer: MEDICAID | Admitting: Oncology

## 2024-02-25 ENCOUNTER — Inpatient Hospital Stay: Payer: MEDICAID | Attending: Oncology

## 2024-03-03 ENCOUNTER — Ambulatory Visit: Payer: MEDICAID | Admitting: Oncology

## 2024-03-03 ENCOUNTER — Other Ambulatory Visit: Payer: MEDICAID

## 2024-05-31 ENCOUNTER — Ambulatory Visit: Payer: MEDICAID

## 2024-05-31 ENCOUNTER — Ambulatory Visit
Admission: EM | Admit: 2024-05-31 | Discharge: 2024-05-31 | Disposition: A | Discharge status: Home / Self Care | Payer: MEDICAID | Attending: Family Medicine | Admitting: Family Medicine

## 2024-05-31 DIAGNOSIS — S62001A Unspecified fracture of navicular [scaphoid] bone of right wrist, initial encounter for closed fracture: Secondary | ICD-10-CM | POA: Diagnosis not present

## 2024-05-31 DIAGNOSIS — M25531 Pain in right wrist: Secondary | ICD-10-CM

## 2024-05-31 DIAGNOSIS — S6721XA Crushing injury of right hand, initial encounter: Secondary | ICD-10-CM | POA: Diagnosis not present

## 2024-05-31 MED ORDER — KETOROLAC TROMETHAMINE 30 MG/ML IJ SOLN
30.0000 mg | Freq: Once | INTRAMUSCULAR | Status: AC
  Administered 2024-05-31: 30 mg via INTRAMUSCULAR

## 2024-05-31 MED ORDER — MUPIROCIN 2 % EX OINT: 1.0000 | TOPICAL_OINTMENT | Freq: Two times a day (BID) | CUTANEOUS | 1 refills | Status: AC

## 2024-05-31 MED ORDER — HYDROCODONE-ACETAMINOPHEN 5-325 MG PO TABS: 1.0000 | ORAL_TABLET | Freq: Four times a day (QID) | ORAL | 0 refills | Status: AC | PRN
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
## 2024-05-31 NOTE — Discharge Instructions (Addendum)
 X-ray of the right hand done today.  Although the x-ray is negative for an acute fracture, the radiologist did note that there may be a possibility of a scaphoid fracture.  Clinically there is significant tenderness in this area therefore we will treat for a probable scaphoid fracture.  He will need to wear a brace day and night on the wrist and avoid any heavy activity with this hand until you follow-up with orthopedics.  Recommend following up with orthopedics in 7 to 10 days for reevaluation. Ice the area 2-3 times daily for 10-15 minutes to help with pain and swelling. Do not apply ice directly to the skin.  May use the sling for comfort.  Try to elevate the hand during the day to help with swelling.  Today we have given you an injection of Toradol  which is a medication for pain but is not a narcotic.  We will call in pain medication and advised her to use this sparingly and at the lowest dose that controls your pain.  May use ibuprofen  for mild to moderate pain starting tomorrow.  We recommend the following: Toradol  injection given today. This is a medication to help with pain. This is not a narcotic.   Hydrocodone /acetaminophen  5/325 mg 1 tablet every 6 hours as needed for pain. Do not take Tylenol  containing products while you are taking the hydrocodone  as it contains Tylenol  as well. May use ibuprofen  for mild to moderate pain starting tomorrow. Mupirocin ointment twice daily to the affected area until healed.  Ice the area 2-3 times daily for 10-15 minutes to help with pain and swelling. Do not apply ice directly to the skin.  May use the sling for comfort Follow-up with orthopedics in 7 to 10 days for reevaluation

## 2024-06-01 ENCOUNTER — Emergency Department (HOSPITAL_COMMUNITY)
Admission: EM | Admit: 2024-06-01 | Discharge: 2024-06-01 | Disposition: A | Discharge status: Home / Self Care | Payer: MEDICAID | Attending: Emergency Medicine | Admitting: Emergency Medicine

## 2024-06-01 ENCOUNTER — Emergency Department (HOSPITAL_COMMUNITY): Payer: MEDICAID

## 2024-06-01 ENCOUNTER — Encounter (HOSPITAL_COMMUNITY): Payer: Self-pay | Admitting: *Deleted

## 2024-06-01 ENCOUNTER — Other Ambulatory Visit: Payer: Self-pay

## 2024-06-01 ENCOUNTER — Ambulatory Visit (HOSPITAL_COMMUNITY): Payer: Self-pay

## 2024-06-01 DIAGNOSIS — D72829 Elevated white blood cell count, unspecified: Secondary | ICD-10-CM | POA: Insufficient documentation

## 2024-06-01 DIAGNOSIS — S6991XA Unspecified injury of right wrist, hand and finger(s), initial encounter: Secondary | ICD-10-CM

## 2024-06-01 DIAGNOSIS — M79641 Pain in right hand: Secondary | ICD-10-CM | POA: Diagnosis present

## 2024-06-01 DIAGNOSIS — S61411A Laceration without foreign body of right hand, initial encounter: Secondary | ICD-10-CM | POA: Insufficient documentation

## 2024-06-01 LAB — CBC WITH DIFFERENTIAL/PLATELET
Abs Immature Granulocytes: 0.04 K/uL (ref 0.00–0.07)
Basophils Absolute: 0.1 K/uL (ref 0.0–0.1)
Basophils Relative: 0 %
Eosinophils Absolute: 0.2 K/uL (ref 0.0–0.5)
Eosinophils Relative: 1 %
HCT: 41.7 % (ref 36.0–46.0)
Hemoglobin: 13.5 g/dL (ref 12.0–15.0)
Immature Granulocytes: 0 %
Lymphocytes Relative: 20 %
Lymphs Abs: 3.1 K/uL (ref 0.7–4.0)
MCH: 27 pg (ref 26.0–34.0)
MCHC: 32.4 g/dL (ref 30.0–36.0)
MCV: 83.4 fL (ref 80.0–100.0)
Monocytes Absolute: 1.3 K/uL — ABNORMAL HIGH (ref 0.1–1.0)
Monocytes Relative: 8 %
Neutro Abs: 11.3 K/uL — ABNORMAL HIGH (ref 1.7–7.7)
Neutrophils Relative %: 71 %
Platelets: 245 K/uL (ref 150–400)
RBC: 5 MIL/uL (ref 3.87–5.11)
RDW: 14.9 % (ref 11.5–15.5)
WBC: 16 K/uL — ABNORMAL HIGH (ref 4.0–10.5)
nRBC: 0 % (ref 0.0–0.2)

## 2024-06-01 LAB — BASIC METABOLIC PANEL WITH GFR
Anion gap: 9 (ref 5–15)
BUN: 14 mg/dL (ref 6–20)
CO2: 24 mmol/L (ref 22–32)
Calcium: 9.2 mg/dL (ref 8.9–10.3)
Chloride: 103 mmol/L (ref 98–111)
Creatinine, Ser: 0.9 mg/dL (ref 0.44–1.00)
GFR, Estimated: >60 mL/min (ref >60)
Glucose, Bld: 91 mg/dL (ref 70–99)
Potassium: 3.9 mmol/L (ref 3.5–5.1)
Sodium: 136 mmol/L (ref 135–145)

## 2024-06-01 MED ORDER — OXYCODONE-ACETAMINOPHEN 5-325 MG PO TABS: 1.0000 | ORAL_TABLET | Freq: Four times a day (QID) | ORAL | 0 refills | Status: AC | PRN

## 2024-06-01 MED ORDER — SENNOSIDES-DOCUSATE SODIUM 8.6-50 MG PO TABS: 1.0000 | ORAL_TABLET | Freq: Every evening | ORAL | 0 refills | Status: AC | PRN

## 2024-06-01 MED ORDER — OXYCODONE-ACETAMINOPHEN 5-325 MG PO TABS
2.0000 | ORAL_TABLET | Freq: Once | ORAL | Status: AC
  Administered 2024-06-01: 2 via ORAL
  Filled 2024-06-01: qty 2

## 2024-06-01 MED ORDER — AMOXICILLIN-POT CLAVULANATE 875-125 MG PO TABS: 1.0000 | ORAL_TABLET | Freq: Two times a day (BID) | ORAL | 0 refills | Status: AC

## 2024-06-01 MED ORDER — AMOXICILLIN-POT CLAVULANATE 875-125 MG PO TABS
1.0000 | ORAL_TABLET | Freq: Once | ORAL | Status: AC
  Administered 2024-06-01: 1 via ORAL
  Filled 2024-06-01: qty 1

## 2024-06-01 MED ORDER — IBUPROFEN 800 MG PO TABS: 800.0000 mg | ORAL_TABLET | Freq: Three times a day (TID) | ORAL | 0 refills | Status: AC | PRN
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
## 2024-06-01 NOTE — Discharge Instructions (Addendum)
 I am starting you on a new pain medication, an anti-inflammatory medication, and an antibiotic.  It is important to take these as directed.  I would like for you to call the hand surgeon listed to schedule a follow-up appointment this week.

## 2024-06-01 NOTE — ED Notes (Signed)
 Reviewed written d/c instructions with pt and all questions answered. Pt verbalized understanding. Pt left in stable condition with all belongings.
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
